# Patient Record
Sex: Male | Born: 1980 | Race: Black or African American | Hispanic: No | Marital: Single | Smoking: Former smoker
Health system: Southern US, Community
[De-identification: ages and names within clinical notes are randomized; demographics above are authoritative.]

## PROBLEM LIST (undated history)

## (undated) DIAGNOSIS — J45909 Unspecified asthma, uncomplicated: Secondary | ICD-10-CM

---
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
## 2001-12-03 ENCOUNTER — Emergency Department (HOSPITAL_COMMUNITY): Admission: EM | Admit: 2001-12-03 | Discharge: 2001-12-03 | Payer: Self-pay | Admitting: Emergency Medicine

## 2002-09-19 ENCOUNTER — Emergency Department (HOSPITAL_COMMUNITY): Admission: EM | Admit: 2002-09-19 | Discharge: 2002-09-19 | Payer: Self-pay | Admitting: Emergency Medicine

## 2002-09-20 ENCOUNTER — Emergency Department (HOSPITAL_COMMUNITY): Admission: EM | Admit: 2002-09-20 | Discharge: 2002-09-20 | Payer: Self-pay | Admitting: Emergency Medicine

## 2002-09-20 ENCOUNTER — Encounter: Payer: Self-pay | Admitting: Emergency Medicine

## 2002-09-26 ENCOUNTER — Emergency Department (HOSPITAL_COMMUNITY): Admission: EM | Admit: 2002-09-26 | Discharge: 2002-09-26 | Payer: Self-pay | Admitting: Emergency Medicine

## 2003-03-07 ENCOUNTER — Emergency Department (HOSPITAL_COMMUNITY): Admission: EM | Admit: 2003-03-07 | Discharge: 2003-03-07 | Payer: Self-pay | Admitting: *Deleted

## 2003-03-07 ENCOUNTER — Encounter: Payer: Self-pay | Admitting: Emergency Medicine

## 2006-03-27 ENCOUNTER — Ambulatory Visit: Payer: Self-pay | Admitting: Family Medicine

## 2006-04-02 ENCOUNTER — Ambulatory Visit: Payer: Self-pay | Admitting: Family Medicine

## 2006-04-16 ENCOUNTER — Emergency Department (HOSPITAL_COMMUNITY): Admission: EM | Admit: 2006-04-16 | Discharge: 2006-04-16 | Payer: Self-pay | Admitting: Emergency Medicine

## 2006-07-09 ENCOUNTER — Emergency Department (HOSPITAL_COMMUNITY): Admission: EM | Admit: 2006-07-09 | Discharge: 2006-07-09 | Payer: Self-pay | Admitting: Emergency Medicine

## 2006-07-11 ENCOUNTER — Emergency Department (HOSPITAL_COMMUNITY): Admission: EM | Admit: 2006-07-11 | Discharge: 2006-07-11 | Payer: Self-pay | Admitting: Emergency Medicine

## 2006-08-28 ENCOUNTER — Emergency Department (HOSPITAL_COMMUNITY): Admission: EM | Admit: 2006-08-28 | Discharge: 2006-08-29 | Payer: Self-pay | Admitting: Emergency Medicine

## 2006-09-30 ENCOUNTER — Ambulatory Visit: Payer: Self-pay | Admitting: Family Medicine

## 2006-10-07 ENCOUNTER — Emergency Department (HOSPITAL_COMMUNITY): Admission: EM | Admit: 2006-10-07 | Discharge: 2006-10-07 | Payer: Self-pay | Admitting: Emergency Medicine

## 2006-10-08 ENCOUNTER — Ambulatory Visit: Payer: Self-pay | Admitting: *Deleted

## 2006-12-03 ENCOUNTER — Emergency Department (HOSPITAL_COMMUNITY): Admission: EM | Admit: 2006-12-03 | Discharge: 2006-12-03 | Payer: Self-pay | Admitting: Emergency Medicine

## 2007-07-23 ENCOUNTER — Encounter (INDEPENDENT_AMBULATORY_CARE_PROVIDER_SITE_OTHER): Payer: Self-pay | Admitting: *Deleted

## 2008-01-24 ENCOUNTER — Emergency Department (HOSPITAL_COMMUNITY): Admission: EM | Admit: 2008-01-24 | Discharge: 2008-01-24 | Payer: Self-pay | Admitting: Emergency Medicine

## 2008-06-23 ENCOUNTER — Emergency Department (HOSPITAL_COMMUNITY): Admission: EM | Admit: 2008-06-23 | Discharge: 2008-06-23 | Payer: Self-pay | Admitting: Emergency Medicine

## 2008-07-21 ENCOUNTER — Emergency Department (HOSPITAL_COMMUNITY): Admission: EM | Admit: 2008-07-21 | Discharge: 2008-07-21 | Payer: Self-pay | Admitting: Emergency Medicine

## 2008-08-11 IMAGING — CR DG CHEST 2V
2 series · 2 of 2 positions shown · non-contrast
Comparison: 07/09/2006

CLINICAL DATA: Asthma, wheezing and shortness of breath

CHEST - 2 VIEW:

[w chest pa]
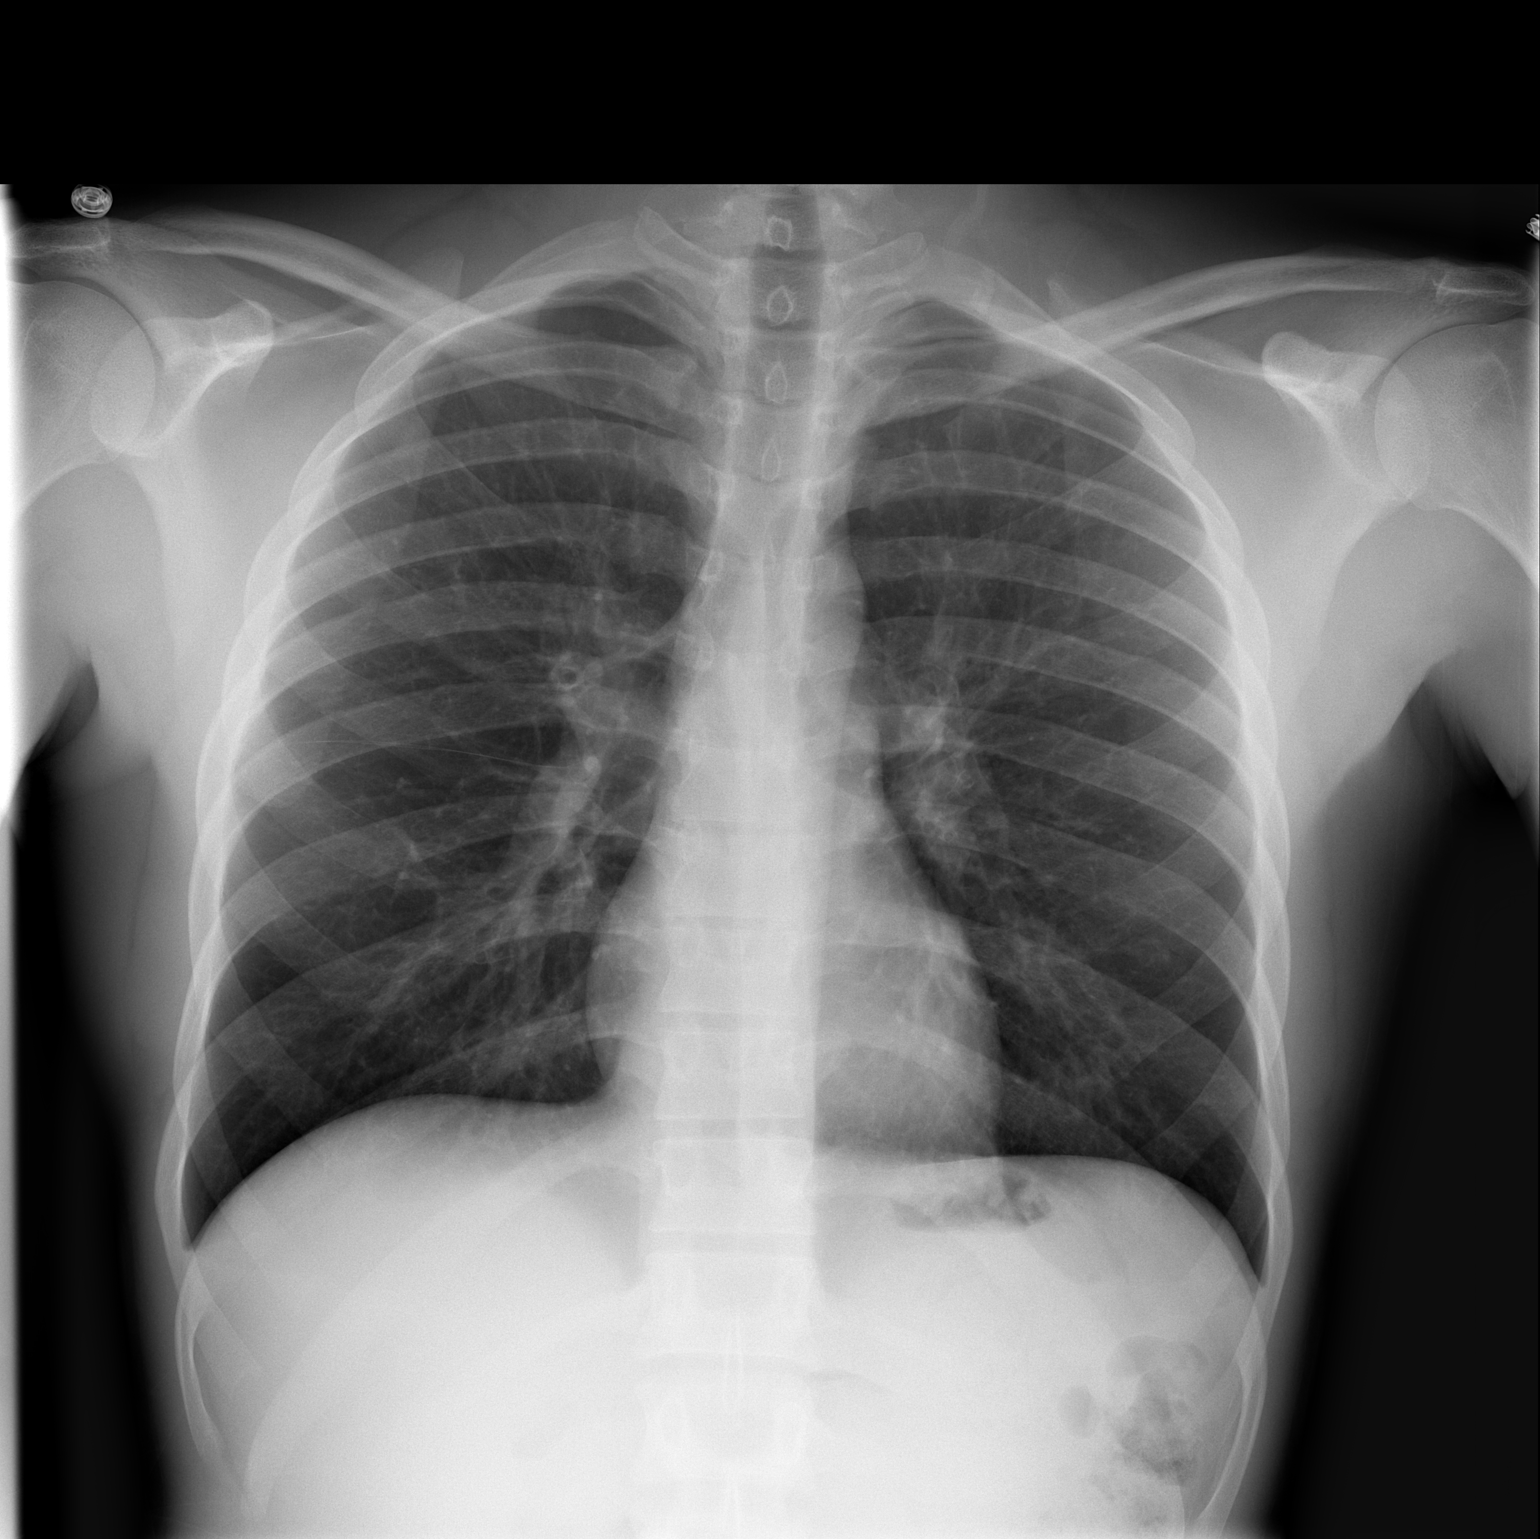

[w chest lat]
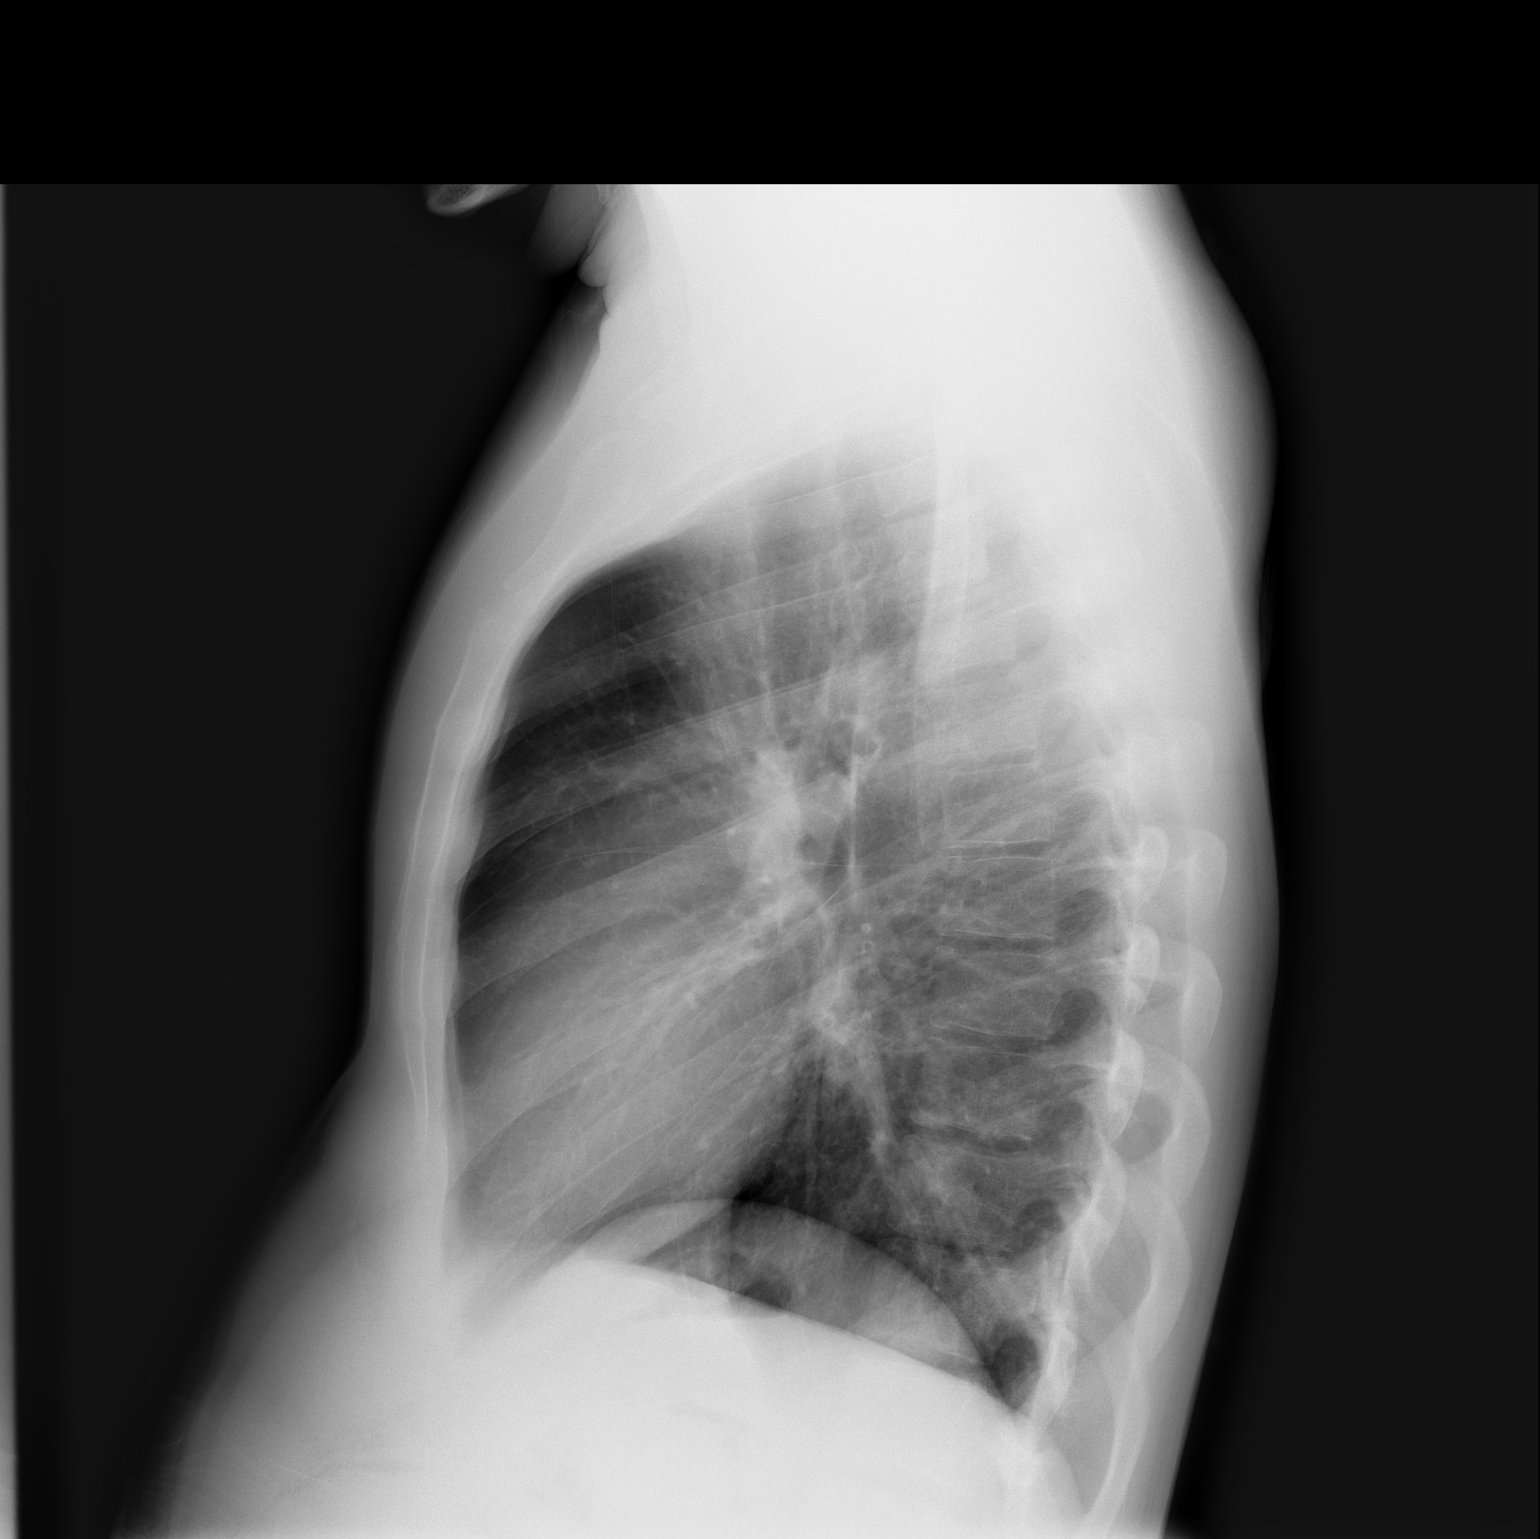

[2 of 2 positions shown; findings below may reference images not displayed]

FINDINGS: Heart and mediastinal contours are within normal limits. There is
central airway thickening without focal airspace opacity or effusion. Visualized
skeleton unremarkable.
IMPRESSION: Stable mild central airway thickening.

## 2008-11-08 ENCOUNTER — Emergency Department (HOSPITAL_COMMUNITY): Admission: EM | Admit: 2008-11-08 | Discharge: 2008-11-08 | Payer: Self-pay | Admitting: Emergency Medicine

## 2009-06-09 ENCOUNTER — Emergency Department (HOSPITAL_COMMUNITY): Admission: EM | Admit: 2009-06-09 | Discharge: 2009-06-09 | Payer: Self-pay | Admitting: Emergency Medicine

## 2009-09-25 ENCOUNTER — Emergency Department (HOSPITAL_COMMUNITY): Admission: EM | Admit: 2009-09-25 | Discharge: 2009-09-25 | Payer: Self-pay | Admitting: Emergency Medicine

## 2011-07-17 ENCOUNTER — Emergency Department (HOSPITAL_COMMUNITY)
Admission: EM | Admit: 2011-07-17 | Discharge: 2011-07-17 | Disposition: A | Discharge status: Home / Self Care | Payer: Medicaid Other | Attending: Emergency Medicine | Admitting: Emergency Medicine

## 2011-07-17 DIAGNOSIS — J40 Bronchitis, not specified as acute or chronic: Secondary | ICD-10-CM | POA: Insufficient documentation

## 2011-07-17 DIAGNOSIS — F17200 Nicotine dependence, unspecified, uncomplicated: Secondary | ICD-10-CM | POA: Insufficient documentation

## 2011-07-17 DIAGNOSIS — R509 Fever, unspecified: Secondary | ICD-10-CM | POA: Insufficient documentation

## 2011-07-17 DIAGNOSIS — J3489 Other specified disorders of nose and nasal sinuses: Secondary | ICD-10-CM | POA: Insufficient documentation

## 2011-07-17 DIAGNOSIS — R0602 Shortness of breath: Secondary | ICD-10-CM | POA: Insufficient documentation

## 2011-07-17 DIAGNOSIS — R05 Cough: Secondary | ICD-10-CM | POA: Insufficient documentation

## 2011-07-17 DIAGNOSIS — J45909 Unspecified asthma, uncomplicated: Secondary | ICD-10-CM | POA: Insufficient documentation

## 2011-07-17 DIAGNOSIS — R059 Cough, unspecified: Secondary | ICD-10-CM | POA: Insufficient documentation

## 2013-02-14 ENCOUNTER — Encounter (HOSPITAL_COMMUNITY): Payer: Self-pay | Admitting: Emergency Medicine

## 2013-02-14 ENCOUNTER — Emergency Department (HOSPITAL_COMMUNITY)
Admission: EM | Admit: 2013-02-14 | Discharge: 2013-02-15 | Disposition: A | Discharge status: Home / Self Care | Payer: Medicaid Other | Attending: Emergency Medicine | Admitting: Emergency Medicine

## 2013-02-14 DIAGNOSIS — R071 Chest pain on breathing: Secondary | ICD-10-CM | POA: Insufficient documentation

## 2013-02-14 DIAGNOSIS — R059 Cough, unspecified: Secondary | ICD-10-CM | POA: Insufficient documentation

## 2013-02-14 DIAGNOSIS — R05 Cough: Secondary | ICD-10-CM

## 2013-02-14 DIAGNOSIS — J45909 Unspecified asthma, uncomplicated: Secondary | ICD-10-CM | POA: Insufficient documentation

## 2013-02-14 DIAGNOSIS — J029 Acute pharyngitis, unspecified: Secondary | ICD-10-CM | POA: Insufficient documentation

## 2013-02-14 DIAGNOSIS — J309 Allergic rhinitis, unspecified: Secondary | ICD-10-CM | POA: Insufficient documentation

## 2013-02-14 DIAGNOSIS — H5789 Other specified disorders of eye and adnexa: Secondary | ICD-10-CM | POA: Insufficient documentation

## 2013-02-14 HISTORY — DX: Unspecified asthma, uncomplicated: J45.909

## 2013-02-14 NOTE — ED Notes (Signed)
Per EMS patient c/o scratchy throat runny nose and has not been able to sleep. Pt is Alert and orient V/S/S, lung sounds clear and ambulatory.

## 2013-02-14 NOTE — ED Notes (Signed)
[id number]<AV> Expected date:<BR> Expected time:<BR> Means of arrival:<BR> Comments:<BR> EMS/hx asthma-cold and flu symptoms-EMS states clear lungs

## 2013-02-15 MED ORDER — ALBUTEROL SULFATE (5 MG/ML) 0.5% IN NEBU
2.5000 mg | INHALATION_SOLUTION | RESPIRATORY_TRACT | Status: DC
  Administered 2013-02-15: 2.5 mg via RESPIRATORY_TRACT
  Filled 2013-02-15: qty 0.5

## 2013-02-15 MED ORDER — OXYMETAZOLINE HCL 0.05 % NA SOLN
1.0000 | Freq: Once | NASAL | Status: AC
  Administered 2013-02-15: 1 via NASAL
  Filled 2013-02-15: qty 15

## 2013-02-15 MED ORDER — BENZONATATE 200 MG PO CAPS: 200.0000 mg | ORAL_CAPSULE | Freq: Once | ORAL | Status: DC

## 2013-02-15 MED ORDER — IPRATROPIUM BROMIDE 0.02 % IN SOLN
0.5000 mg | RESPIRATORY_TRACT | Status: DC
  Administered 2013-02-15: 0.5 mg via RESPIRATORY_TRACT
  Filled 2013-02-15: qty 2.5

## 2013-02-15 MED ORDER — LORATADINE 10 MG PO TABS: 10.0000 mg | ORAL_TABLET | Freq: Once | ORAL | Status: DC

## 2013-02-15 MED ORDER — AEROCHAMBER PLUS FLO-VU LARGE MISC
1.0000 | Freq: Once | Status: AC
  Administered 2013-02-15: 1
  Filled 2013-02-15: qty 1

## 2013-02-15 MED ORDER — BENZONATATE 100 MG PO CAPS
200.0000 mg | ORAL_CAPSULE | Freq: Once | ORAL | Status: AC
  Administered 2013-02-15: 200 mg via ORAL
  Filled 2013-02-15: qty 2

## 2013-02-15 MED ORDER — LORATADINE 10 MG PO TABS
10.0000 mg | ORAL_TABLET | Freq: Once | ORAL | Status: AC
  Administered 2013-02-15: 10 mg via ORAL
  Filled 2013-02-15: qty 1

## 2013-02-15 NOTE — ED Provider Notes (Signed)
History     CSN: [id number]  Arrival date & time 02/14/13  2347   First MD Initiated Contact with Patient 02/15/13 0002      Chief Complaint  Patient presents with  . Nasal Congestion    (Consider location/radiation/quality/duration/timing/severity/associated sxs/prior treatment) HPI Jason Roy is a 32 y.o. male presents with a two-day history of runny nose, sinus congestion, sinus pressure, cough, and mild to moderate scratchy sore throat pain. Patient has had difficulty sleeping secondary to his symptoms and cough. Patient is had some associated itchy watery eyes that have been red. Patient's had no difficulty breathing, he's had some mild sharp chest pain is only there when he is coughing, he's had no other chest pain, no dizziness or lightheadedness, no fever, no chills, no abdominal pain, no nausea vomiting or diarrhea.   Past Medical History  Diagnosis Date  . Asthma     No past surgical history on file.  Family History  Problem Relation Age of Onset  . Asthma Mother     History  Substance Use Topics  . Smoking status: Not on file  . Smokeless tobacco: Not on file  . Alcohol Use: No      Review of Systems At least 10pt or greater review of systems completed and are negative except where specified in the HPI.  Allergies  Review of patient's allergies indicates no known allergies.  Home Medications   Current Outpatient Rx  Name  Route  Sig  Dispense  Refill  . benzonatate (TESSALON) 200 MG capsule   Oral   Take 1 capsule (200 mg total) by mouth once.   12 capsule   0   . loratadine (CLARITIN) 10 MG tablet   Oral   Take 1 tablet (10 mg total) by mouth once.   30 tablet   0     BP 147/84  Pulse 79  Temp(Src) 97.7 F (36.5 C) (Oral)  Resp 18  SpO2 100%  Physical Exam  Nursing notes reviewed.  Electronic medical record reviewed. VITAL SIGNS:   Filed Vitals:   02/14/13 2347 02/14/13 2352 02/15/13 0008  BP: 142/103 140/90 147/84  Pulse:  81  79  Temp: 97.7 F (36.5 C)    TempSrc: Oral    Resp: 18    SpO2: 100%  100%   CONSTITUTIONAL: Awake, oriented, appears non-toxic HENT: Atraumatic, normocephalic, oral mucosa pink and moist, airway patent. Cobblestoning of the posterior pharynx. Nasal turbinates are boggy and mildly erythematous with clear nasal drainage. External ears normal. Clear fluid behind the right TM left TM appears normal. EYES: Conjunctiva mildly injected, EOMI, PERRLA NECK: Trachea midline, non-tender, supple CARDIOVASCULAR: Normal heart rate, Normal rhythm, No murmurs, rubs, gallops PULMONARY/CHEST: Clear to auscultation, no rhonchi, wheezes, or rales. Symmetrical breath sounds. Non-tender. NEUROLOGIC: Non-focal, moving all four extremities, no gross sensory or motor deficits. EXTREMITIES: No clubbing, cyanosis, or edema SKIN: Warm, Dry, No erythema, No rash  ED Course  Procedures (including critical care time)  Labs Reviewed - No data to display No results found.   1. Allergic rhinitis   2. Asthma   3. Cough       MDM  Patient to the ED with symptoms of allergic rhinitis, she has a history of asthma, currently on his expirations, he is making some upper airway wheezing noises although I don't hear anything lower. We'll treat with DuoNeb, symptomatic treatments -  But the patient on a second-generation antihistamine to control his symptoms as well as Lawyer  cough.  I explained the diagnosis and have given explicit precautions to return to the ER including chest pain, shortness of breath, asthma exacerbation or any other new or worsening symptoms. The patient understands and accepts the medical plan as it's been dictated and I have answered their questions. Discharge instructions concerning home care and prescriptions have been given.  The patient is STABLE and is discharged to home in good condition.         Jones Skene, MD 02/15/13 2440

## 2013-05-19 ENCOUNTER — Emergency Department (HOSPITAL_COMMUNITY): Payer: Medicaid Other

## 2013-05-19 ENCOUNTER — Encounter (HOSPITAL_COMMUNITY): Payer: Self-pay | Admitting: *Deleted

## 2013-05-19 ENCOUNTER — Emergency Department (HOSPITAL_COMMUNITY)
Admission: EM | Admit: 2013-05-19 | Discharge: 2013-05-19 | Disposition: A | Discharge status: Home / Self Care | Payer: Medicaid Other | Attending: Emergency Medicine | Admitting: Emergency Medicine

## 2013-05-19 DIAGNOSIS — Y998 Other external cause status: Secondary | ICD-10-CM | POA: Insufficient documentation

## 2013-05-19 DIAGNOSIS — Y92410 Unspecified street and highway as the place of occurrence of the external cause: Secondary | ICD-10-CM | POA: Insufficient documentation

## 2013-05-19 DIAGNOSIS — T07XXXA Unspecified multiple injuries, initial encounter: Secondary | ICD-10-CM

## 2013-05-19 DIAGNOSIS — Y9389 Activity, other specified: Secondary | ICD-10-CM | POA: Insufficient documentation

## 2013-05-19 DIAGNOSIS — J45909 Unspecified asthma, uncomplicated: Secondary | ICD-10-CM | POA: Insufficient documentation

## 2013-05-19 DIAGNOSIS — Z79899 Other long term (current) drug therapy: Secondary | ICD-10-CM | POA: Insufficient documentation

## 2013-05-19 DIAGNOSIS — IMO0002 Reserved for concepts with insufficient information to code with codable children: Secondary | ICD-10-CM | POA: Insufficient documentation

## 2013-05-19 MED ORDER — IBUPROFEN 800 MG PO TABS: 800.0000 mg | ORAL_TABLET | Freq: Three times a day (TID) | ORAL | Status: DC | PRN

## 2013-05-19 MED ORDER — IBUPROFEN 400 MG PO TABS
800.0000 mg | ORAL_TABLET | Freq: Once | ORAL | Status: AC
  Administered 2013-05-19: 800 mg via ORAL
  Filled 2013-05-19: qty 2
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
## 2013-05-19 NOTE — ED Notes (Signed)
Ortho at bedside at this time.

## 2013-05-19 NOTE — Progress Notes (Signed)
Orthopedic Tech Progress Note Patient Details:  Jason Roy [birth year] [id number]  Ortho Devices Type of Ortho Device: Crutches   Haskell Flirt 05/19/2013, 2:04 AM
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
## 2013-05-20 NOTE — ED Provider Notes (Signed)
Medical screening examination/treatment/procedure(s) were performed by non-physician practitioner and as supervising physician I was immediately available for consultation/collaboration.   Brandt Loosen, MD 05/20/13 [phone]

## 2014-08-05 ENCOUNTER — Encounter (HOSPITAL_COMMUNITY): Payer: Self-pay | Admitting: Emergency Medicine

## 2014-08-05 ENCOUNTER — Emergency Department (HOSPITAL_COMMUNITY)
Admission: EM | Admit: 2014-08-05 | Discharge: 2014-08-05 | Disposition: A | Discharge status: Home / Self Care | Payer: Medicaid Other | Attending: Emergency Medicine | Admitting: Emergency Medicine

## 2014-08-05 ENCOUNTER — Emergency Department (HOSPITAL_COMMUNITY): Payer: Medicaid Other

## 2014-08-05 DIAGNOSIS — Z7952 Long term (current) use of systemic steroids: Secondary | ICD-10-CM | POA: Diagnosis not present

## 2014-08-05 DIAGNOSIS — J45909 Unspecified asthma, uncomplicated: Secondary | ICD-10-CM | POA: Diagnosis not present

## 2014-08-05 DIAGNOSIS — M791 Myalgia: Secondary | ICD-10-CM | POA: Diagnosis not present

## 2014-08-05 DIAGNOSIS — Z79899 Other long term (current) drug therapy: Secondary | ICD-10-CM | POA: Diagnosis not present

## 2014-08-05 DIAGNOSIS — R000 Tachycardia, unspecified: Secondary | ICD-10-CM | POA: Insufficient documentation

## 2014-08-05 DIAGNOSIS — R51 Headache: Secondary | ICD-10-CM | POA: Insufficient documentation

## 2014-08-05 DIAGNOSIS — J029 Acute pharyngitis, unspecified: Secondary | ICD-10-CM | POA: Diagnosis not present

## 2014-08-05 DIAGNOSIS — J020 Streptococcal pharyngitis: Secondary | ICD-10-CM

## 2014-08-05 LAB — CBC
HEMATOCRIT: 45.5 % (ref 39.0–52.0)
Hemoglobin: 16 g/dL (ref 13.0–17.0)
MCH: 31.5 pg (ref 26.0–34.0)
MCHC: 35.2 g/dL (ref 30.0–36.0)
MCV: 89.6 fL (ref 78.0–100.0)
Platelets: 227 10*3/uL (ref 150–400)
RBC: 5.08 MIL/uL (ref 4.22–5.81)
RDW: 12.8 % (ref 11.5–15.5)
WBC: 17.5 10*3/uL — ABNORMAL HIGH (ref 4.0–10.5)

## 2014-08-05 LAB — BASIC METABOLIC PANEL
ANION GAP: 15 (ref 5–15)
BUN: 14 mg/dL (ref 6–23)
CHLORIDE: 95 meq/L — AB (ref 96–112)
CO2: 28 meq/L (ref 19–32)
CREATININE: 1.64 mg/dL — AB (ref 0.50–1.35)
Calcium: 9.8 mg/dL (ref 8.4–10.5)
GFR calc non Af Amer: 54 mL/min — ABNORMAL LOW (ref >90)
GFR, EST AFRICAN AMERICAN: 62 mL/min — AB (ref >90)
Glucose, Bld: 99 mg/dL (ref 70–99)
Potassium: 4.1 mEq/L (ref 3.7–5.3)
SODIUM: 138 meq/L (ref 137–147)

## 2014-08-05 LAB — RAPID STREP SCREEN (MED CTR MEBANE ONLY): STREPTOCOCCUS, GROUP A SCREEN (DIRECT): POSITIVE — AB

## 2014-08-05 MED ORDER — HYDROCODONE-ACETAMINOPHEN 7.5-325 MG/15ML PO SOLN: 15.0000 mL | ORAL | Status: DC | PRN

## 2014-08-05 MED ORDER — ACETAMINOPHEN 325 MG PO TABS
650.0000 mg | ORAL_TABLET | Freq: Once | ORAL | Status: AC
  Administered 2014-08-05: 650 mg via ORAL
  Filled 2014-08-05: qty 2

## 2014-08-05 MED ORDER — SODIUM CHLORIDE 0.9 % IV BOLUS (SEPSIS)
1000.0000 mL | Freq: Once | INTRAVENOUS | Status: AC
  Administered 2014-08-05: 1000 mL via INTRAVENOUS

## 2014-08-05 MED ORDER — PENICILLIN G BENZATHINE 1200000 UNIT/2ML IM SUSP
1.2000 10*6.[IU] | Freq: Once | INTRAMUSCULAR | Status: AC
  Administered 2014-08-05: 1.2 10*6.[IU] via INTRAMUSCULAR
  Filled 2014-08-05: qty 2

## 2014-08-05 NOTE — ED Notes (Signed)
Pt c/o sore throat, URI sx and body aches x 2 days; fever noted

## 2014-08-05 NOTE — Discharge Instructions (Signed)
Take Hycet for severe pain only. No driving or operating heavy machinery while taking Hycet. This medication may cause drowsiness.  Strep Throat Strep throat is an infection of the throat caused by a bacteria named Streptococcus pyogenes. Your health care provider may call the infection streptococcal "tonsillitis" or "pharyngitis" depending on whether there are signs of inflammation in the tonsils or back of the throat. Strep throat is most common in children aged 5-15 years during the cold months of the year, but it can occur in people of any age during any season. This infection is spread from person to person (contagious) through coughing, sneezing, or other close contact. SIGNS AND SYMPTOMS   Fever or chills.  Painful, swollen, red tonsils or throat.  Pain or difficulty when swallowing.  White or yellow spots on the tonsils or throat.  Swollen, tender lymph nodes or "glands" of the neck or under the jaw.  Red rash all over the body (rare). DIAGNOSIS  Many different infections can cause the same symptoms. A test must be done to confirm the diagnosis so the right treatment can be given. A "rapid strep test" can help your health care provider make the diagnosis in a few minutes. If this test is not available, a light swab of the infected area can be used for a throat culture test. If a throat culture test is done, results are usually available in a day or two. TREATMENT  Strep throat is treated with antibiotic medicine. HOME CARE INSTRUCTIONS   Gargle with 1 tsp of salt in 1 cup of warm water, 3-4 times per day or as needed for comfort.  Family members who also have a sore throat or fever should be tested for strep throat and treated with antibiotics if they have the strep infection.  Make sure everyone in your household washes their hands well.  Do not share food, drinking cups, or personal items that could cause the infection to spread to others.  You may need to eat a soft food  diet until your sore throat gets better.  Drink enough water and fluids to keep your urine clear or pale yellow. This will help prevent dehydration.  Get plenty of rest.  Stay home from school, day care, or work until you have been on antibiotics for 24 hours.  Take medicines only as directed by your health care provider.  Take your antibiotic medicine as directed by your health care provider. Finish it even if you start to feel better. SEEK MEDICAL CARE IF:   The glands in your neck continue to enlarge.  You develop a rash, cough, or earache.  You cough up green, yellow-brown, or bloody sputum.  You have pain or discomfort not controlled by medicines.  Your problems seem to be getting worse rather than better.  You have a fever. SEEK IMMEDIATE MEDICAL CARE IF:   You develop any new symptoms such as vomiting, severe headache, stiff or painful neck, chest pain, shortness of breath, or trouble swallowing.  You develop severe throat pain, drooling, or changes in your voice.  You develop swelling of the neck, or the skin on the neck becomes red and tender.  You develop signs of dehydration, such as fatigue, dry mouth, and decreased urination.  You become increasingly sleepy, or you cannot wake up completely. MAKE SURE YOU:  Understand these instructions.  Will watch your condition.  Will get help right away if you are not doing well or get worse. Document Released: 10/19/2000 Document Revised: 03/08/2014  Document Reviewed: 12/21/2010 [facility] Patient Information 2015 [place]. This information is not intended to replace advice given to you by your health care provider. Make sure you discuss any questions you have with your health care provider.

## 2014-08-05 NOTE — ED Provider Notes (Signed)
CSN: [id number]     Arrival date & time 08/05/14  1245 History  This chart was scribed for non-physician practitioner, Johnnette Gourd, PA-C, working with Gerhard Munch, MD by Charline Bills, ED Scribe. This patient was seen in room TR08C/TR08C and the patient's care was started at 1:00 PM.   Chief Complaint  Patient presents with  . Sore Throat  . Generalized Body Aches   The history is provided by the patient. No language interpreter was used.   HPI Comments: RAUNAK ANTUNA is a 33 y.o. male who presents to the Emergency Department complaining of gradually worsening sore throat onset 2 days ago. Pt states that pain is so severe that he has not been able to eat anything but applesauce for 2 days. He reports associated cough, myalgias, HA, fever. No medications tried PTA.  Past Medical History  Diagnosis Date  . Asthma    History reviewed. No pertinent past surgical history. Family History  Problem Relation Age of Onset  . Asthma Mother    History  Substance Use Topics  . Smoking status: Not on file  . Smokeless tobacco: Not on file  . Alcohol Use: No    Review of Systems  Constitutional: Positive for fever.  HENT: Positive for sore throat.   Respiratory: Positive for cough.   Musculoskeletal: Positive for myalgias.  Neurological: Positive for headaches.  All other systems reviewed and are negative.  Allergies  Review of patient's allergies indicates no known allergies.  Home Medications   Prior to Admission medications   Medication Sig Start Date End Date Taking? Authorizing Provider  albuterol (PROVENTIL HFA;VENTOLIN HFA) 108 (90 BASE) MCG/ACT inhaler Inhale 2 puffs into the lungs every 6 (six) hours as needed for wheezing.    Historical Provider, MD  beclomethasone (QVAR) 80 MCG/ACT inhaler Inhale 1 puff into the lungs 2 (two) times daily.    Historical Provider, MD  HYDROcodone-acetaminophen (HYCET) 7.5-325 mg/15 ml solution Take 15 mLs by mouth every 4 (four) hours as  needed for moderate pain or severe pain. 08/05/14   Trevor Mace, PA-C  ibuprofen (ADVIL,MOTRIN) 800 MG tablet Take 1 tablet (800 mg total) by mouth every 8 (eight) hours as needed for pain. 05/19/13   Phill Mutter Dammen, PA-C  montelukast (SINGULAIR) 10 MG tablet Take 10 mg by mouth at bedtime.    Historical Provider, MD   Triage Vitals: BP 147/87  Pulse 112  Temp(Src) 100.3 F (37.9 C) (Oral)  Resp 20  SpO2 99% Physical Exam  Nursing note and vitals reviewed. Constitutional: He is oriented to person, place, and time. He appears well-developed and well-nourished. No distress.  Clammy  HENT:  Head: Normocephalic and atraumatic.  Mouth/Throat: Posterior oropharyngeal edema and posterior oropharyngeal erythema present. No oropharyngeal exudate or tonsillar abscesses.  Eyes: Conjunctivae and EOM are normal.  Neck: Normal range of motion. Neck supple.  Cardiovascular: Regular rhythm and normal heart sounds.  Tachycardia present.   Pulmonary/Chest: Effort normal and breath sounds normal.  Musculoskeletal: Normal range of motion. He exhibits no edema.  Lymphadenopathy:    He has cervical adenopathy (anterior).  Neurological: He is alert and oriented to person, place, and time.  Skin: Skin is warm and dry.  Psychiatric: He has a normal mood and affect. His behavior is normal.   ED Course  Procedures (including critical care time) DIAGNOSTIC STUDIES: Oxygen Saturation is 99% on RA, normal by my interpretation.    COORDINATION OF CARE: 1:04 PM-Discussed treatment plan which includes strep screen  and IV fluids with pt at bedside and pt agreed to plan.   Labs Review Labs Reviewed  RAPID STREP SCREEN - Abnormal; Notable for the following:    Streptococcus, Group A Screen (Direct) POSITIVE (*)    All other components within normal limits  CBC - Abnormal; Notable for the following:    WBC 17.5 (*)    All other components within normal limits  BASIC METABOLIC PANEL - Abnormal; Notable for  the following:    Chloride 95 (*)    Creatinine, Ser 1.64 (*)    GFR calc non Af Amer 54 (*)    GFR calc Af Amer 62 (*)    All other components within normal limits   Imaging Review Dg Chest 2 View  08/05/2014   CLINICAL DATA:  33 year old male with sore throat fever cough and generalized body aches x 3 days. Initial encounter.  EXAM: CHEST  2 VIEW  COMPARISON:  12/03/2006 and earlier.  FINDINGS: Mildly lower lung volumes. Normal cardiac size and mediastinal contours. Visualized tracheal air column is within normal limits. No pneumothorax, pulmonary edema, pleural effusion or confluent pulmonary opacity. No osseous abnormality identified.  IMPRESSION: Negative, no acute cardiopulmonary abnormality.   Electronically Signed   By: Augusto GambleLee  Hall M.D.   On: 08/05/2014 13:50    EKG Interpretation None      MDM   Final diagnoses:  Strep throat   Temperature 100.3, tachycardic, vitals otherwise stable. He is nontoxic-appearing and in no apparent distress. Patient received Tylenol and IV fluids with improvement of vital signs. Rapid strep positive. no tonsillar abscess. Leukocytosis of 17.5. Tolerates PO. Treated with Bicillin injection in the emergency department. Discharge with hycet. Stable for d/c. Return precautions given. Patient states understanding of treatment care plan and is agreeable.  I personally performed the services described in this documentation, which was scribed in my presence. The recorded information has been reviewed and is accurate.    Trevor Maceobyn M Albert, PA-C 08/05/14 1417  Trevor Maceobyn M Albert, PA-C 08/05/14 [phone]

## 2014-08-05 NOTE — ED Provider Notes (Signed)
Medical screening examination/treatment/procedure(s) were performed by non-physician practitioner and as supervising physician I was immediately available for consultation/collaboration.  Gerhard Munchobert Jahniyah Revere, MD 08/05/14 [phone]

## 2014-11-09 ENCOUNTER — Emergency Department (HOSPITAL_COMMUNITY)
Admission: EM | Admit: 2014-11-09 | Discharge: 2014-11-09 | Disposition: A | Discharge status: Home / Self Care | Payer: Medicaid Other | Attending: Emergency Medicine | Admitting: Emergency Medicine

## 2014-11-09 ENCOUNTER — Emergency Department (HOSPITAL_COMMUNITY): Payer: Medicaid Other

## 2014-11-09 ENCOUNTER — Encounter (HOSPITAL_COMMUNITY): Payer: Self-pay | Admitting: Neurology

## 2014-11-09 DIAGNOSIS — R000 Tachycardia, unspecified: Secondary | ICD-10-CM | POA: Diagnosis not present

## 2014-11-09 DIAGNOSIS — Z7951 Long term (current) use of inhaled steroids: Secondary | ICD-10-CM | POA: Insufficient documentation

## 2014-11-09 DIAGNOSIS — J45909 Unspecified asthma, uncomplicated: Secondary | ICD-10-CM | POA: Diagnosis present

## 2014-11-09 DIAGNOSIS — Z79899 Other long term (current) drug therapy: Secondary | ICD-10-CM | POA: Diagnosis not present

## 2014-11-09 DIAGNOSIS — Z87891 Personal history of nicotine dependence: Secondary | ICD-10-CM | POA: Diagnosis not present

## 2014-11-09 DIAGNOSIS — J45901 Unspecified asthma with (acute) exacerbation: Secondary | ICD-10-CM

## 2014-11-09 LAB — CBC WITH DIFFERENTIAL/PLATELET
BASOS ABS: 0 10*3/uL (ref 0.0–0.1)
BASOS PCT: 0 % (ref 0–1)
EOS ABS: 0.1 10*3/uL (ref 0.0–0.7)
Eosinophils Relative: 2 % (ref 0–5)
HCT: 47.2 % (ref 39.0–52.0)
Hemoglobin: 16.6 g/dL (ref 13.0–17.0)
LYMPHS ABS: 1.7 10*3/uL (ref 0.7–4.0)
Lymphocytes Relative: 27 % (ref 12–46)
MCH: 32.7 pg (ref 26.0–34.0)
MCHC: 35.2 g/dL (ref 30.0–36.0)
MCV: 92.9 fL (ref 78.0–100.0)
Monocytes Absolute: 0.7 10*3/uL (ref 0.1–1.0)
Monocytes Relative: 11 % (ref 3–12)
NEUTROS PCT: 60 % (ref 43–77)
Neutro Abs: 3.8 10*3/uL (ref 1.7–7.7)
PLATELETS: 216 10*3/uL (ref 150–400)
RBC: 5.08 MIL/uL (ref 4.22–5.81)
RDW: 13.5 % (ref 11.5–15.5)
WBC: 6.4 10*3/uL (ref 4.0–10.5)

## 2014-11-09 LAB — I-STAT CHEM 8, ED
BUN: 9 mg/dL (ref 6–23)
CREATININE: 1.2 mg/dL (ref 0.50–1.35)
Calcium, Ion: 1.18 mmol/L (ref 1.12–1.23)
Chloride: 97 mEq/L (ref 96–112)
GLUCOSE: 85 mg/dL (ref 70–99)
HCT: 53 % — ABNORMAL HIGH (ref 39.0–52.0)
HEMOGLOBIN: 18 g/dL — AB (ref 13.0–17.0)
Potassium: 3.6 mmol/L (ref 3.5–5.1)
SODIUM: 138 mmol/L (ref 135–145)
TCO2: 24 mmol/L (ref 0–100)

## 2014-11-09 LAB — I-STAT TROPONIN, ED: Troponin i, poc: 0 ng/mL (ref 0.00–0.08)

## 2014-11-09 MED ORDER — IBUPROFEN 400 MG PO TABS
600.0000 mg | ORAL_TABLET | Freq: Once | ORAL | Status: AC
  Administered 2014-11-09: 600 mg via ORAL
  Filled 2014-11-09 (×2): qty 1

## 2014-11-09 MED ORDER — PREDNISONE 20 MG PO TABS: ORAL_TABLET | ORAL | Status: DC

## 2014-11-09 MED ORDER — IPRATROPIUM-ALBUTEROL 0.5-2.5 (3) MG/3ML IN SOLN
3.0000 mL | Freq: Once | RESPIRATORY_TRACT | Status: AC
  Administered 2014-11-09: 3 mL via RESPIRATORY_TRACT
  Filled 2014-11-09: qty 3

## 2014-11-09 MED ORDER — BENZONATATE 100 MG PO CAPS: 100.0000 mg | ORAL_CAPSULE | Freq: Three times a day (TID) | ORAL | Status: DC

## 2014-11-09 MED ORDER — PREDNISONE 20 MG PO TABS
60.0000 mg | ORAL_TABLET | Freq: Once | ORAL | Status: AC
  Administered 2014-11-09: 60 mg via ORAL
  Filled 2014-11-09: qty 3

## 2014-11-09 NOTE — ED Provider Notes (Signed)
CSN: [id number]     Arrival date & time 11/09/14  1035 History   First MD Initiated Contact with Patient 11/09/14 1037     Chief Complaint  Patient presents with  . Asthma     (Consider location/radiation/quality/duration/timing/severity/associated sxs/prior Treatment) HPI  Jason Roy is a 34 y.o. male with PMH of asthma presenting with 5 day history of URI symptoms as well as wheezing. Patient with Qvar, albuterol nebulizer, Singulair home without improvement of symptoms he has also been taking Mucinex without relief. Pt with cough productive of thick sputum. Patient has been given a total 10 mg albuterol and 0.5 Atrovent by EMS. Patient also with subjective fevers and cough productive of thick sputum. Patient denies requiring steroids or hospitalization for asthma for over 3 years ago. Patient denies chest pain, nausea, vomiting, abdominal pain. He did note one episode of emesis yesterday after coughing spell. No blood. No home O2.   Past Medical History  Diagnosis Date  . Asthma    History reviewed. No pertinent past surgical history. Family History  Problem Relation Age of Onset  . Asthma Mother    History  Substance Use Topics  . Smoking status: Former Games developer  . Smokeless tobacco: Not on file  . Alcohol Use: No    Review of Systems 10 Systems reviewed and are negative for acute change except as noted in the HPI.    Allergies  Review of patient's allergies indicates no known allergies.  Home Medications   Prior to Admission medications   Medication Sig Start Date End Date Taking? Authorizing Provider  albuterol (PROVENTIL HFA;VENTOLIN HFA) 108 (90 BASE) MCG/ACT inhaler Inhale 2 puffs into the lungs every 6 (six) hours as needed for wheezing.   Yes Historical Provider, MD  beclomethasone (QVAR) 80 MCG/ACT inhaler Inhale 1 puff into the lungs 2 (two) times daily.   Yes Historical Provider, MD  guaiFENesin (MUCINEX) 600 MG 12 hr tablet Take 600 mg by mouth 2 (two)  times daily.   Yes Historical Provider, MD  ibuprofen (ADVIL,MOTRIN) 800 MG tablet Take 1 tablet (800 mg total) by mouth every 8 (eight) hours as needed for pain. 05/19/13  Yes Peter S Dammen, PA-C  montelukast (SINGULAIR) 10 MG tablet Take 10 mg by mouth at bedtime.   Yes Historical Provider, MD  benzonatate (TESSALON) 100 MG capsule Take 1 capsule (100 mg total) by mouth every 8 (eight) hours. 11/09/14   Louann Sjogren, PA-C  HYDROcodone-acetaminophen (HYCET) 7.5-325 mg/15 ml solution Take 15 mLs by mouth every 4 (four) hours as needed for moderate pain or severe pain. Patient not taking: Reported on 11/09/2014 08/05/14   Kathrynn Speed, PA-C  predniSONE (DELTASONE) 20 MG tablet 2 tabs po daily x 5 days 11/09/14   [place] L Phylisha Dix, PA-C   BP 130/85 mmHg  Pulse 106  Temp(Src) 99.3 F (37.4 C) (Oral)  Resp 15  Ht  (1.753 m)  Wt 193 lb (87.544 kg)  BMI 28.49 kg/m2  SpO2 92% Physical Exam  Constitutional: He appears well-developed and well-nourished. No distress.  HENT:  Head: Normocephalic and atraumatic.  Eyes: Conjunctivae and EOM are normal. Right eye exhibits no discharge. Left eye exhibits no discharge.  Cardiovascular: Regular rhythm.   Tachycardic.  Pulmonary/Chest:  Patient with diffuse wheezes and rhonchi with diminished air movement. Patient with symmetrical chest expansion. No accessory muscle use or respiratory distress noted.  Abdominal: Soft. Bowel sounds are normal. He exhibits no distension. There is no tenderness.  Neurological:  He is alert. He exhibits normal muscle tone. Coordination normal.  Skin: Skin is warm and dry. He is not diaphoretic.  Nursing note and vitals reviewed.   ED Course  Procedures (including critical care time) Labs Review Labs Reviewed  I-STAT CHEM 8, ED - Abnormal; Notable for the following:    Hemoglobin 18.0 (*)    HCT 53.0 (*)    All other components within normal limits  CBC WITH DIFFERENTIAL  I-STAT TROPOININ, ED    Imaging  Review Dg Chest 2 View  11/09/2014   CLINICAL DATA:  Cough starting Thursday, fever, shortness of Breath  EXAM: CHEST  2 VIEW  COMPARISON:  08/05/2014  FINDINGS: Cardiomediastinal silhouette is stable. No acute infiltrate or pleural effusion. No pulmonary edema. Mild perihilar bronchitic changes. Bony thorax is unremarkable.  IMPRESSION: No acute infiltrate or pulmonary edema. Mild perihilar bronchitic changes.   Electronically Signed   By: Natasha MeadLiviu  Pop M.D.   On: 11/09/2014 12:59     EKG Interpretation   Date/Time:  Tuesday November 09 2014 10:37:44 EST Ventricular Rate:  108 PR Interval:  166 QRS Duration: 87 QT Interval:  317 QTC Calculation: 425 R Axis:   88 Text Interpretation:  Sinus tachycardia LAE, consider biatrial enlargement  ST elev, probable normal early repol pattern Baseline wander in lead(s) V2  V3 V4 V6 No previous ECGs available Confirmed by RANCOUR  MD, STEPHEN  ([id number]) on 11/09/2014 10:44:42 AM      MDM   Final diagnoses:  Asthma  Asthma exacerbation   Patient ambulated in ED with O2 saturations maintained >90, no current signs of respiratory distress. CXR without evidence of PNA.  Pt with tachycardia to 122 with ambulation but returned to 115 with rest. Pt given 10mg  albuterol by EMS which is likely contributing.  Lung exam improved after nebulizer treatment. Prednisone given in the ED and pt will bd dc with 5 day burst. Pt states they are breathing at baseline. Pt has been instructed to continue using prescribed medications and to speak with PCP about today's exacerbation.   Discussed return precautions with patient. Discussed all results and patient verbalizes understanding and agrees with plan.  Case has been discussed with Dr. Manus Gunningancour who agrees with the above plan and to discharge.    Louann SjogrenVictoria L Izeyah Deike, PA-C 11/09/14 1456  Glynn OctaveStephen Rancour, MD 11/09/14 Paulo [id number]

## 2014-11-09 NOTE — ED Notes (Signed)
Per EMS- pt comes from home reports since Friday as had asthma exacerbation, has been using inhalers and muccinex with no relief. Has aches and sore throat. Wheezing and rhonchi noted. Given total of 10 mg albuterol, 0.5 atrovent. BP 150/80, HR 107, 98% treatment mask, RR 22.

## 2014-11-09 NOTE — Discharge Instructions (Signed)
Return to the emergency room with worsening of symptoms, new symptoms or with symptoms that are concerning especially chest pain, shortness of breath, persistent or worsening symptoms, coughing up blood, feel dizzy lightheaded or faint. Drink plenty of fluids with electrolytes especially Gatorade. OTC cold medications such as mucinex, nyquil, dayquil are recommended. Chloraseptic for sore throat.   Please call your doctor for a followup appointment within 24-48 hours. When you talk to your doctor please let them know that you were seen in the emergency department and have them acquire all of your records so that they can discuss the findings with you and formulate a treatment plan to fully care for your new and ongoing problems.   Asthma Asthma is a recurring condition in which the airways tighten and narrow. Asthma can make it difficult to breathe. It can cause coughing, wheezing, and shortness of breath. Asthma episodes, also called asthma attacks, range from minor to life-threatening. Asthma cannot be cured, but medicines and lifestyle changes can help control it. CAUSES Asthma is believed to be caused by inherited (genetic) and environmental factors, but its exact cause is unknown. Asthma may be triggered by allergens, lung infections, or irritants in the air. Asthma triggers are different for each person. Common triggers include:   Animal dander.  Dust mites.  Cockroaches.  Pollen from trees or grass.  Mold.  Smoke.  Air pollutants such as dust, household cleaners, hair sprays, aerosol sprays, paint fumes, strong chemicals, or strong odors.  Cold air, weather changes, and winds (which increase molds and pollens in the air).  Strong emotional expressions such as crying or laughing hard.  Stress.  Certain medicines (such as aspirin) or types of drugs (such as beta-blockers).  Sulfites in foods and drinks. Foods and drinks that may contain sulfites include dried fruit, potato  chips, and sparkling grape juice.  Infections or inflammatory conditions such as the flu, a cold, or an inflammation of the nasal membranes (rhinitis).  Gastroesophageal reflux disease (GERD).  Exercise or strenuous activity. SYMPTOMS Symptoms may occur immediately after asthma is triggered or many hours later. Symptoms include:  Wheezing.  Excessive nighttime or early morning coughing.  Frequent or severe coughing with a common cold.  Chest tightness.  Shortness of breath. DIAGNOSIS  The diagnosis of asthma is made by a review of your medical history and a physical exam. Tests may also be performed. These may include:  Lung function studies. These tests show how much air you breathe in and out.  Allergy tests.  Imaging tests such as X-rays. TREATMENT  Asthma cannot be cured, but it can usually be controlled. Treatment involves identifying and avoiding your asthma triggers. It also involves medicines. There are 2 classes of medicine used for asthma treatment:   Controller medicines. These prevent asthma symptoms from occurring. They are usually taken every day.  Reliever or rescue medicines. These quickly relieve asthma symptoms. They are used as needed and provide short-term relief. Your health care provider will help you create an asthma action plan. An asthma action plan is a written plan for managing and treating your asthma attacks. It includes a list of your asthma triggers and how they may be avoided. It also includes information on when medicines should be taken and when their dosage should be changed. An action plan may also involve the use of a device called a peak flow meter. A peak flow meter measures how well the lungs are working. It helps you monitor your condition. HOME CARE INSTRUCTIONS  Take medicines only as directed by your health care provider. Speak with your health care provider if you have questions about how or when to take the medicines.  Use a peak  flow meter as directed by your health care provider. Record and keep track of readings.  Understand and use the action plan to help minimize or stop an asthma attack without needing to seek medical care.  Control your home environment in the following ways to help prevent asthma attacks:  Do not smoke. Avoid being exposed to secondhand smoke.  Change your heating and air conditioning filter regularly.  Limit your use of fireplaces and wood stoves.  Get rid of pests (such as roaches and mice) and their droppings.  Throw away plants if you see mold on them.  Clean your floors and dust regularly. Use unscented cleaning products.  Try to have someone else vacuum for you regularly. Stay out of rooms while they are being vacuumed and for a short while afterward. If you vacuum, use a dust mask from a hardware store, a double-layered or microfilter vacuum cleaner bag, or a vacuum cleaner with a HEPA filter.  Replace carpet with wood, tile, or vinyl flooring. Carpet can trap dander and dust.  Use allergy-proof pillows, mattress covers, and box spring covers.  Wash bed sheets and blankets every week in hot water and dry them in a dryer.  Use blankets that are made of polyester or cotton.  Clean bathrooms and kitchens with bleach. If possible, have someone repaint the walls in these rooms with mold-resistant paint. Keep out of the rooms that are being cleaned and painted.  Wash hands frequently. SEEK MEDICAL CARE IF:   You have wheezing, shortness of breath, or a cough even if taking medicine to prevent attacks.  The colored mucus you cough up (sputum) is thicker than usual.  Your sputum changes from clear or white to yellow, green, gray, or bloody.  You have any problems that may be related to the medicines you are taking (such as a rash, itching, swelling, or trouble breathing).  You are using a reliever medicine more than 2-3 times per week.  Your peak flow is still at 50-79% of  your personal best after following your action plan for 1 hour.  You have a fever. SEEK IMMEDIATE MEDICAL CARE IF:   You seem to be getting worse and are unresponsive to treatment during an asthma attack.  You are short of breath even at rest.  You get short of breath when doing very little physical activity.  You have difficulty eating, drinking, or talking due to asthma symptoms.  You develop chest pain.  You develop a fast heartbeat.  You have a bluish color to your lips or fingernails.  You are light-headed, dizzy, or faint.  Your peak flow is less than 50% of your personal best. MAKE SURE YOU:   Understand these instructions.  Will watch your condition.  Will get help right away if you are not doing well or get worse. Document Released: 10/22/2005 Document Revised: 03/08/2014 Document Reviewed: 05/21/2013 [facility] Patient Information 2015 [place], [place]. This information is not intended to replace advice given to you by your health care provider. Make sure you discuss any questions you have with your health care provider.

## 2014-11-09 NOTE — ED Notes (Signed)
Patient transported to X-ray 

## 2014-11-09 NOTE — ED Notes (Signed)
Gave patient orange juice

## 2015-02-25 ENCOUNTER — Emergency Department (HOSPITAL_COMMUNITY)
Admission: EM | Admit: 2015-02-25 | Discharge: 2015-02-25 | Disposition: A | Discharge status: Home / Self Care | Payer: Medicaid Other | Attending: Emergency Medicine | Admitting: Emergency Medicine

## 2015-02-25 DIAGNOSIS — S61431A Puncture wound without foreign body of right hand, initial encounter: Secondary | ICD-10-CM | POA: Insufficient documentation

## 2015-02-25 DIAGNOSIS — Y9289 Other specified places as the place of occurrence of the external cause: Secondary | ICD-10-CM | POA: Diagnosis not present

## 2015-02-25 DIAGNOSIS — J45909 Unspecified asthma, uncomplicated: Secondary | ICD-10-CM | POA: Diagnosis not present

## 2015-02-25 DIAGNOSIS — S61432A Puncture wound without foreign body of left hand, initial encounter: Secondary | ICD-10-CM | POA: Insufficient documentation

## 2015-02-25 DIAGNOSIS — Z7951 Long term (current) use of inhaled steroids: Secondary | ICD-10-CM | POA: Diagnosis not present

## 2015-02-25 DIAGNOSIS — S60511A Abrasion of right hand, initial encounter: Secondary | ICD-10-CM | POA: Diagnosis not present

## 2015-02-25 DIAGNOSIS — S60519A Abrasion of unspecified hand, initial encounter: Secondary | ICD-10-CM

## 2015-02-25 DIAGNOSIS — Z87891 Personal history of nicotine dependence: Secondary | ICD-10-CM | POA: Insufficient documentation

## 2015-02-25 DIAGNOSIS — S61439A Puncture wound without foreign body of unspecified hand, initial encounter: Secondary | ICD-10-CM

## 2015-02-25 DIAGNOSIS — S61459A Open bite of unspecified hand, initial encounter: Secondary | ICD-10-CM

## 2015-02-25 DIAGNOSIS — Y998 Other external cause status: Secondary | ICD-10-CM | POA: Diagnosis not present

## 2015-02-25 DIAGNOSIS — S60512A Abrasion of left hand, initial encounter: Secondary | ICD-10-CM | POA: Diagnosis not present

## 2015-02-25 DIAGNOSIS — W5501XA Bitten by cat, initial encounter: Secondary | ICD-10-CM | POA: Diagnosis not present

## 2015-02-25 DIAGNOSIS — Z79899 Other long term (current) drug therapy: Secondary | ICD-10-CM | POA: Diagnosis not present

## 2015-02-25 DIAGNOSIS — Y9389 Activity, other specified: Secondary | ICD-10-CM | POA: Diagnosis not present

## 2015-02-25 DIAGNOSIS — Z23 Encounter for immunization: Secondary | ICD-10-CM | POA: Diagnosis not present

## 2015-02-25 MED ORDER — BACITRACIN 500 UNIT/GM EX OINT
4.0000 "application " | TOPICAL_OINTMENT | Freq: Once | CUTANEOUS | Status: AC
  Administered 2015-02-25: 4 via TOPICAL
  Filled 2015-02-25: qty 3.6

## 2015-02-25 MED ORDER — AMOXICILLIN-POT CLAVULANATE 875-125 MG PO TABS: 1.0000 | ORAL_TABLET | Freq: Two times a day (BID) | ORAL | Status: DC

## 2015-02-25 MED ORDER — RABIES VACCINE, PCEC IM SUSR
1.0000 mL | Freq: Once | INTRAMUSCULAR | Status: AC
  Administered 2015-02-25: 1 mL via INTRAMUSCULAR
  Filled 2015-02-25: qty 1

## 2015-02-25 MED ORDER — HYDROCODONE-ACETAMINOPHEN 5-325 MG PO TABS
1.0000 | ORAL_TABLET | Freq: Once | ORAL | Status: AC
  Administered 2015-02-25: 1 via ORAL
  Filled 2015-02-25: qty 1

## 2015-02-25 MED ORDER — AMOXICILLIN-POT CLAVULANATE 875-125 MG PO TABS
1.0000 | ORAL_TABLET | Freq: Once | ORAL | Status: AC
  Administered 2015-02-25: 1 via ORAL
  Filled 2015-02-25: qty 1

## 2015-02-25 MED ORDER — TETANUS-DIPHTH-ACELL PERTUSSIS 5-2.5-18.5 LF-MCG/0.5 IM SUSP
0.5000 mL | Freq: Once | INTRAMUSCULAR | Status: AC
  Administered 2015-02-25: 0.5 mL via INTRAMUSCULAR
  Filled 2015-02-25: qty 0.5

## 2015-02-25 MED ORDER — RABIES IMMUNE GLOBULIN 150 UNIT/ML IM INJ
20.0000 [IU]/kg | INJECTION | Freq: Once | INTRAMUSCULAR | Status: AC
  Administered 2015-02-25: 1725 [IU] via INTRAMUSCULAR
  Filled 2015-02-25: qty 11.5

## 2015-02-25 MED ORDER — HYDROCODONE-ACETAMINOPHEN 5-325 MG PO TABS: ORAL_TABLET | ORAL | Status: DC

## 2015-02-25 NOTE — ED Provider Notes (Signed)
CSN: [id number]     Arrival date & time 02/25/15  1803 History  This chart was scribed for non-physician practitioner Wynetta Emery, PA working with Margarita Grizzle, MD, by Tanda Rockers, ED Scribe. This patient was seen in room TR02C/TR02C and the patient's care was started at 6:31 PM.    Chief Complaint  Patient presents with  . Animal Bite   The history is provided by the patient. No language interpreter was used.     HPI Comments: Jason Roy is a 34 y.o. male brought in by ambulance, who presents to the Emergency Department complaining of cat bite that occurred earlier today. Pt accidentally let a woman's cat out of the house and was trying to catch it when it bit and scratched him on bilateral arms and hands. Pt is now unsure whether the car was the woman's cat or a random outside cat. He denies any other symptoms. Pt is not UTD on tetanus. Pain is minimal.   Past Medical History  Diagnosis Date  . Asthma    No past surgical history on file. Family History  Problem Relation Age of Onset  . Asthma Mother    History  Substance Use Topics  . Smoking status: Former Games developer  . Smokeless tobacco: Not on file  . Alcohol Use: No    Review of Systems  A complete 10 system review of systems was obtained and all systems are negative except as noted in the HPI and PMH.    Allergies  Review of patient's allergies indicates no known allergies.  Home Medications   Prior to Admission medications   Medication Sig Start Date End Date Taking? Authorizing Provider  albuterol (PROVENTIL HFA;VENTOLIN HFA) 108 (90 BASE) MCG/ACT inhaler Inhale 2 puffs into the lungs every 6 (six) hours as needed for wheezing.    Historical Provider, MD  beclomethasone (QVAR) 80 MCG/ACT inhaler Inhale 1 puff into the lungs 2 (two) times daily.    Historical Provider, MD  benzonatate (TESSALON) 100 MG capsule Take 1 capsule (100 mg total) by mouth every 8 (eight) hours. 11/09/14   Oswaldo Conroy, PA-C   guaiFENesin (MUCINEX) 600 MG 12 hr tablet Take 600 mg by mouth 2 (two) times daily.    Historical Provider, MD  HYDROcodone-acetaminophen (HYCET) 7.5-325 mg/15 ml solution Take 15 mLs by mouth every 4 (four) hours as needed for moderate pain or severe pain. Patient not taking: Reported on 11/09/2014 08/05/14   Kathrynn Speed, PA-C  ibuprofen (ADVIL,MOTRIN) 800 MG tablet Take 1 tablet (800 mg total) by mouth every 8 (eight) hours as needed for pain. 05/19/13   Peter Dammen, PA-C  montelukast (SINGULAIR) 10 MG tablet Take 10 mg by mouth at bedtime.    Historical Provider, MD  predniSONE (DELTASONE) 20 MG tablet 2 tabs po daily x 5 days 11/09/14   Oswaldo Conroy, PA-C   Triage Vitals: BP 153/109 mmHg  Pulse 90  Temp(Src) 99.4 F (37.4 C) (Oral)  Resp 20  Ht  (1.753 m)  Wt 193 lb (87.544 kg)  BMI 28.49 kg/m2  SpO2 98%   Physical Exam  Constitutional: He is oriented to person, place, and time. He appears well-developed and well-nourished. No distress.  HENT:  Head: Normocephalic and atraumatic.  Eyes: Conjunctivae and EOM are normal.  Neck: Neck supple. No tracheal deviation present.  Cardiovascular: Normal rate.   Pulmonary/Chest: Effort normal. No stridor. No respiratory distress.  Musculoskeletal: Normal range of motion.  Neurological: He is alert and oriented to  person, place, and time.  Skin: Skin is warm and dry.  Multiple abrasions and puncture wounds to bilateral hands on the dorsum there is a small, 2 cm linear abrasion to the volar side of the right wrist.  Psychiatric: He has a normal mood and affect. His behavior is normal.  Nursing note and vitals reviewed.   ED Course  Procedures (including critical care time)  DIAGNOSTIC STUDIES: Oxygen Saturation is 98% on RA, normal by my interpretation.    COORDINATION OF CARE: 6:47 PM-Discussed treatment plan which includes rabies vaccination with pt at bedside and pt agreed to plan.   Labs Review Labs Reviewed - No data to  display  Imaging Review No results found.   EKG Interpretation None      MDM   Final diagnoses:  Cat bite of hand, unspecified laterality, initial encounter  Puncture wound, hand, unspecified laterality, initial encounter  Abrasion, hand, unspecified laterality, initial encounter    Filed Vitals:   02/25/15 1808  BP: 153/109  Pulse: 90  Temp: 99.4 F (37.4 C)  TempSrc: Oral  Resp: 20  Height: 5\' 9"  (1.753 m)  Weight: 193 lb (87.544 kg)  SpO2: 98%    Medications  rabies vaccine (RABAVERT) injection 1 mL (not administered)  rabies immune globulin (HYPERAB) injection 1,725 Units (not administered)  Tdap (BOOSTRIX) injection 0.5 mL (0.5 mLs Intramuscular Given 02/25/15 1951)  amoxicillin-clavulanate (AUGMENTIN) 875-125 MG per tablet 1 tablet (1 tablet Oral Given 02/25/15 1951)  HYDROcodone-acetaminophen (NORCO/VICODIN) 5-325 MG per tablet 1 tablet (1 tablet Oral Given 02/25/15 1951)    Jason Roy is a pleasant 34 y.o. male presenting with multiple abrasions and puncture wounds to cat bite and scratch occurring to hands just prior to arrival. Cat is unknown to him, recommend rabies vaccination. We'll give him Augmentin and update the tetanus as well. 11.5 mL of rabies IgG injected into the bilateral hands. We've had an extensive discussion on how to present to the urgent care center for boosters.  Evaluation does not show pathology that would require ongoing emergent intervention or inpatient treatment. Pt is hemodynamically stable and mentating appropriately. Discussed findings and plan with patient/guardian, who agrees with care plan. All questions answered. Return precautions discussed and outpatient follow up given.   Discharge Medication List as of 02/25/2015  8:50 PM    START taking these medications   Details  amoxicillin-clavulanate (AUGMENTIN) 875-125 MG per tablet Take 1 tablet by mouth every 12 (twelve) hours., Starting 02/25/2015, Until Discontinued, Print     HYDROcodone-acetaminophen (NORCO/VICODIN) 5-325 MG per tablet Take 1-2 tablets by mouth every 6 hours as needed for pain and/or cough., Print         I personally performed the services described in this documentation, which was scribed in my presence. The recorded information has been reviewed and is accurate.      Wynetta Emeryicole Esti Demello, PA-C 02/25/15 [id number]  Margarita Grizzleanielle Ray, MD 02/26/15 [phone]

## 2015-02-25 NOTE — Discharge Instructions (Signed)
Take your antibiotics as directed and to completion. You should never have any leftover antibiotics! Push fluids and stay well hydrated.   If you see signs of infection (warmth, redness, tenderness, pus, sharp increase in pain, fever, red streaking) immediately return to the emergency department.  Take vicodin for breakthrough pain, do not drink alcohol, drive, care for children or do other critical tasks while taking vicodin.  You need a rabies booster shots on the following days: Date 02/28/2015     To:  Urgent Care  Date 03/04/2015    To:  Urgent Care  Date 03/11/2015   To:  Urgent Care  Wash the affected area with soap and water and apply a thin layer of topical antibiotic ointment. Do this every 12 hours.   Do not use rubbing alcohol or hydrogen peroxide.                        Look for signs of infection: if you see redness, if the area becomes warm, if pain increases sharply, there is discharge (pus), if red streaks appear or you develop fever or vomiting, RETURN immediately to the Emergency Department  for a recheck.    Animal Bite An animal bite can result in a scratch on the skin, deep open cut, puncture of the skin, crush injury, or tearing away of the skin or a body part. Dogs are responsible for most animal bites. Children are bitten more often than adults. An animal bite can range from very mild to more serious. A small bite from your house pet is no cause for alarm. However, some animal bites can become infected or injure a bone or other tissue. You must seek medical care if:  The skin is broken and bleeding does not slow down or stop after 15 minutes.  The puncture is deep and difficult to clean (such as a cat bite).  Pain, warmth, redness, or pus develops around the wound.  The bite is from a stray animal or rodent. There may be a risk of rabies infection.  The bite is from a snake, raccoon, skunk, fox, coyote, or bat. There may be a risk of rabies infection.  The  person bitten has a chronic illness such as diabetes, liver disease, or cancer, or the person takes medicine that lowers the immune system.  There is concern about the location and severity of the bite. It is important to clean and protect an animal bite wound right away to prevent infection. Follow these steps:  Clean the wound with plenty of water and soap.  Apply an antibiotic cream.  Apply gentle pressure over the wound with a clean towel or gauze to slow or stop bleeding.  Elevate the affected area above the heart to help stop any bleeding.  Seek medical care. Getting medical care within 8 hours of the animal bite leads to the best possible outcome. DIAGNOSIS  Your caregiver will most likely:  Take a detailed history of the animal and the bite injury.  Perform a wound exam.  Take your medical history. Blood tests or X-rays may be performed. Sometimes, infected bite wounds are cultured and sent to a lab to identify the infectious bacteria.  TREATMENT  Medical treatment will depend on the location and type of animal bite as well as the patient's medical history. Treatment may include:  Wound care, such as cleaning and flushing the wound with saline solution, bandaging, and elevating the affected area.  Antibiotics.  Tetanus  immunization.  Rabies immunization.  Leaving the wound open to heal. This is often done with animal bites, due to the high risk of infection. However, in certain cases, wound closure with stitches, wound adhesive, skin adhesive strips, or staples may be used. Infected bites that are left untreated may require intravenous (IV) antibiotics and surgical treatment in the hospital. HOME CARE INSTRUCTIONS  Follow your caregiver's instructions for wound care.  Take all medicines as directed.  If your caregiver prescribes antibiotics, take them as directed. Finish them even if you start to feel better.  Follow up with your caregiver for further exams or  immunizations as directed. You may need a tetanus shot if:  You cannot remember when you had your last tetanus shot.  You have never had a tetanus shot.  The injury broke your skin. If you get a tetanus shot, your arm may swell, get red, and feel warm to the touch. This is common and not a problem. If you need a tetanus shot and you choose not to have one, there is a rare chance of getting tetanus. Sickness from tetanus can be serious. SEEK MEDICAL CARE IF:  You notice warmth, redness, soreness, swelling, pus discharge, or a bad smell coming from the wound.  You have a red line on the skin coming from the wound.  You have a fever, chills, or a general ill feeling.  You have nausea or vomiting.  You have continued or worsening pain.  You have trouble moving the injured part.  You have other questions or concerns. MAKE SURE YOU:  Understand these instructions.  Will watch your condition.  Will get help right away if you are not doing well or get worse. Document Released: 07/10/2011 Document Revised: 01/14/2012 Document Reviewed: 07/10/2011 [facility] Patient Information 2015 [place], [place]. This information is not intended to replace advice given to you by your health care provider. Make sure you discuss any questions you have with your health care provider.

## 2015-02-25 NOTE — ED Notes (Signed)
Pt to ED via [facility] after reported being attacked by a cat.  Pt st's he was trying to catch the cat for a friend and the cat bit and scratched him on bil hand and arms

## 2015-02-25 NOTE — ED Notes (Signed)
Patient is alert and orientedx4.  Patient was explained discharge instructions and they understood them with no questions.  The patient is riding the bus.

## 2015-02-28 ENCOUNTER — Encounter (HOSPITAL_COMMUNITY): Payer: Self-pay | Admitting: Emergency Medicine

## 2015-02-28 ENCOUNTER — Emergency Department (INDEPENDENT_AMBULATORY_CARE_PROVIDER_SITE_OTHER)
Admission: EM | Admit: 2015-02-28 | Discharge: 2015-02-28 | Disposition: A | Discharge status: Home / Self Care | Payer: Medicaid Other | Source: Home / Self Care

## 2015-02-28 DIAGNOSIS — Z203 Contact with and (suspected) exposure to rabies: Secondary | ICD-10-CM | POA: Diagnosis not present

## 2015-02-28 MED ORDER — RABIES VACCINE, PCEC IM SUSR
INTRAMUSCULAR | Status: AC
  Filled 2015-02-28: qty 1

## 2015-02-28 MED ORDER — RABIES VACCINE, PCEC IM SUSR
1.0000 mL | Freq: Once | INTRAMUSCULAR | Status: AC
  Administered 2015-02-28: 1 mL via INTRAMUSCULAR

## 2015-02-28 NOTE — ED Notes (Signed)
Patient here for his second Rabies injection.  Pt reports no problems.

## 2015-03-04 ENCOUNTER — Emergency Department (INDEPENDENT_AMBULATORY_CARE_PROVIDER_SITE_OTHER)
Admission: EM | Admit: 2015-03-04 | Discharge: 2015-03-04 | Disposition: A | Discharge status: Home / Self Care | Payer: Medicaid Other | Source: Home / Self Care

## 2015-03-04 DIAGNOSIS — Z203 Contact with and (suspected) exposure to rabies: Secondary | ICD-10-CM | POA: Diagnosis not present

## 2015-03-04 MED ORDER — RABIES VACCINE, PCEC IM SUSR
INTRAMUSCULAR | Status: AC
  Filled 2015-03-04: qty 1

## 2015-03-04 MED ORDER — RABIES VACCINE, PCEC IM SUSR
1.0000 mL | Freq: Once | INTRAMUSCULAR | Status: AC
  Administered 2015-03-04: 1 mL via INTRAMUSCULAR

## 2015-03-04 NOTE — ED Notes (Signed)
Here for rabies shot #3 in series. Reports no problems

## 2015-03-11 ENCOUNTER — Encounter (HOSPITAL_COMMUNITY): Payer: Self-pay | Admitting: *Deleted

## 2015-03-11 ENCOUNTER — Emergency Department (INDEPENDENT_AMBULATORY_CARE_PROVIDER_SITE_OTHER)
Admission: EM | Admit: 2015-03-11 | Discharge: 2015-03-11 | Disposition: A | Discharge status: Home / Self Care | Payer: Self-pay | Source: Home / Self Care

## 2015-03-11 DIAGNOSIS — Z203 Contact with and (suspected) exposure to rabies: Secondary | ICD-10-CM

## 2015-03-11 MED ORDER — RABIES VACCINE, PCEC IM SUSR
1.0000 mL | Freq: Once | INTRAMUSCULAR | Status: AC
  Administered 2015-03-11: 1 mL via INTRAMUSCULAR

## 2015-03-11 MED ORDER — RABIES VACCINE, PCEC IM SUSR
INTRAMUSCULAR | Status: AC
  Filled 2015-03-11: qty 1

## 2015-03-11 NOTE — ED Notes (Signed)
Here  For  The  Last  In his  Series  Of  Rabies  Injections      Verbalizes  No  Complaints

## 2015-06-14 ENCOUNTER — Emergency Department (HOSPITAL_COMMUNITY)
Admission: EM | Admit: 2015-06-14 | Discharge: 2015-06-14 | Disposition: A | Discharge status: Home / Self Care | Payer: Medicaid Other | Attending: Emergency Medicine | Admitting: Emergency Medicine

## 2015-06-14 DIAGNOSIS — Z7951 Long term (current) use of inhaled steroids: Secondary | ICD-10-CM | POA: Diagnosis not present

## 2015-06-14 DIAGNOSIS — J069 Acute upper respiratory infection, unspecified: Secondary | ICD-10-CM | POA: Diagnosis not present

## 2015-06-14 DIAGNOSIS — R0981 Nasal congestion: Secondary | ICD-10-CM | POA: Diagnosis present

## 2015-06-14 DIAGNOSIS — Z87891 Personal history of nicotine dependence: Secondary | ICD-10-CM | POA: Diagnosis not present

## 2015-06-14 DIAGNOSIS — J45909 Unspecified asthma, uncomplicated: Secondary | ICD-10-CM | POA: Insufficient documentation

## 2015-06-14 DIAGNOSIS — Z79899 Other long term (current) drug therapy: Secondary | ICD-10-CM | POA: Diagnosis not present

## 2015-06-14 NOTE — ED Notes (Signed)
Pt with Hx of asthma c/o nasal drainage onset 2 days ago followed by sinus pain, dry mouth, coughing, lightheadedness, periorbital edema. Pt states he has been taking sudafed, nyquil, dayquil, mucinex, miscellaneous allergy pills without relief.

## 2015-06-14 NOTE — ED Notes (Signed)
MD in to see patient. Repeatedly informed patient that there were no antibiotics given for viruses. Pt became angry and began to curse loudly at MD and nearby staff. Making comments like "Man, fuck this hospital. Fuck these people. I don't feel well, and this is how you treat me?! I'm never coming back to this hospital. Stupid bitch." Then he proceeded to walk out of the room, cursing loudly at surrounding staff members while leaving.

## 2015-06-14 NOTE — ED Provider Notes (Signed)
CSN: [id number]     Arrival date & time 06/14/15  0102 History   First MD Initiated Contact with Patient 06/14/15 [phone]     Chief Complaint  Patient presents with  . Cough  . Nasal Congestion     (Consider location/radiation/quality/duration/timing/severity/associated sxs/prior Treatment) HPI Comments: 34 year old male with a history of asthma who presents with nasal drainage, sinus pain, and cough. The patient states that 2 days ago he began having nasal drainage and sinus pressure. He later developed a dry cough that is worse at night. He has had occasional sinus headache and reports that he feels weak. He has been taking multiple over-the-counter medications including Mucinex, Sudafed, DayQuil with no relief. He does report a dry mouth for which he has been drinking a lot of fluids. No fevers. No shortness of breath or wheezing. He reports that he woke up and feels like he has puffy eyes this morning.  Patient is a 34 y.o. male presenting with cough. The history is provided by the patient.  Cough   Past Medical History  Diagnosis Date  . Asthma    No past surgical history on file. Family History  Problem Relation Age of Onset  . Asthma Mother    History  Substance Use Topics  . Smoking status: Former Games developer  . Smokeless tobacco: Not on file  . Alcohol Use: No    Review of Systems  Respiratory: Positive for cough.    10 Systems reviewed and are negative for acute change except as noted in the HPI.    Allergies  Review of patient's allergies indicates no known allergies.  Home Medications   Prior to Admission medications   Medication Sig Start Date End Date Taking? Authorizing Provider  albuterol (PROVENTIL HFA;VENTOLIN HFA) 108 (90 BASE) MCG/ACT inhaler Inhale 2 puffs into the lungs every 6 (six) hours as needed for wheezing.   Yes Historical Provider, MD  albuterol (PROVENTIL) (5 MG/ML) 0.5% nebulizer solution Inhale 2.5 mg into the lungs every 4 (four) hours as  needed for wheezing or shortness of breath.  08/10/14  Yes Historical Provider, MD  beclomethasone (QVAR) 80 MCG/ACT inhaler Inhale 1 puff into the lungs 2 (two) times daily.   Yes Historical Provider, MD  guaiFENesin (MUCINEX) 600 MG 12 hr tablet Take 600 mg by mouth 2 (two) times daily as needed for cough or to loosen phlegm.    Yes Historical Provider, MD  lisinopril (PRINIVIL,ZESTRIL) 10 MG tablet Take 10 mg by mouth daily. 05/31/15  Yes Historical Provider, MD  montelukast (SINGULAIR) 10 MG tablet Take 10 mg by mouth at bedtime.   Yes Historical Provider, MD  phenylephrine (SUDAFED PE) 10 MG TABS tablet Take 10 mg by mouth every 4 (four) hours as needed (congestion).   Yes Historical Provider, MD  pravastatin (PRAVACHOL) 80 MG tablet Take 80 mg by mouth every evening. 05/31/15  Yes Historical Provider, MD  Pseudoephedrine-APAP-DM (DAYQUIL PO) Take 30 mLs by mouth every 8 (eight) hours as needed (cold symptoms).   Yes Historical Provider, MD  amoxicillin-clavulanate (AUGMENTIN) 875-125 MG per tablet Take 1 tablet by mouth every 12 (twelve) hours. Patient not taking: Reported on 06/14/2015 02/25/15   Joni Reining Pisciotta, PA-C  HYDROcodone-acetaminophen (NORCO/VICODIN) 5-325 MG per tablet Take 1-2 tablets by mouth every 6 hours as needed for pain and/or cough. Patient not taking: Reported on 06/14/2015 02/25/15   Joni Reining Pisciotta, PA-C   BP 130/94 mmHg  Pulse 95  Temp(Src) 98 F (36.7 C) (Oral)  Resp 18  SpO2 93% Physical Exam  Constitutional: He is oriented to person, place, and time. He appears well-developed and well-nourished. No distress.  HENT:  Head: Normocephalic and atraumatic.  Moist mucous membranes  Eyes: Conjunctivae are normal. Pupils are equal, round, and reactive to light.  Neck: Neck supple.  Cardiovascular: Normal rate, regular rhythm and normal heart sounds.   No murmur heard. Pulmonary/Chest: Effort normal and breath sounds normal. No respiratory distress. He has no wheezes.   Abdominal: Soft. Bowel sounds are normal. He exhibits no distension. There is no tenderness.  Musculoskeletal: He exhibits no edema.  Neurological: He is alert and oriented to person, place, and time.  Fluent speech  Skin: Skin is warm and dry.  Psychiatric: He has a normal mood and affect. Judgment normal.  Became agitated and verbally hostile during end of interview  Nursing note and vitals reviewed.   ED Course  Procedures (including critical care time) Labs Review Labs Reviewed - No data to display  Imaging Review No results found.   EKG Interpretation None      MDM   Final diagnoses:  None  viral upper respiratory infection   34 year old male who presents with 2 days of nasal congestion, dry cough, and sinus pressure that has not been relieved by over-the-counter cold medications. At presentation, patient was well-appearing with reassuring vital signs. He was breathing comfortably and had no wheezing on exam. His symptoms are consistent with a viral upper respiratory infection. I suspect that his dry mouth and lightheadedness are side effects of the over-the-counter medications. I instructed the patient on supportive care including sleeping with head of bed elevated, cough suppressant at night, and continued supportive care of viral illness. Patient asked for antibiotic and I explained that it would not improve his viral symptoms. Patient became very agitated and began cursing at me, stating "so I came here for nothing. You're not even helping me. I don't feel well and I need medicine." I explained that no medication would shorten the duration of viral symptoms and I explained that the patient has no wheezing or respiratory abnormalities on exam, so he does not currently need steroids for asthma. He denies CP or SOB. I had already reviewed reasons to return including shortness of breath or wheezing. Patient became very agitated and walked out with police escort, refusing to take  any discharge paperwork.   Laurence Spates, MD 06/14/15 [phone]

## 2015-09-25 ENCOUNTER — Emergency Department (HOSPITAL_COMMUNITY)
Admission: EM | Admit: 2015-09-25 | Discharge: 2015-09-25 | Disposition: A | Discharge status: Home / Self Care | Payer: Medicaid Other | Attending: Emergency Medicine | Admitting: Emergency Medicine

## 2015-09-25 ENCOUNTER — Encounter (HOSPITAL_COMMUNITY): Payer: Self-pay

## 2015-09-25 DIAGNOSIS — Z7951 Long term (current) use of inhaled steroids: Secondary | ICD-10-CM | POA: Insufficient documentation

## 2015-09-25 DIAGNOSIS — J069 Acute upper respiratory infection, unspecified: Secondary | ICD-10-CM | POA: Insufficient documentation

## 2015-09-25 DIAGNOSIS — R05 Cough: Secondary | ICD-10-CM | POA: Diagnosis present

## 2015-09-25 DIAGNOSIS — Z79899 Other long term (current) drug therapy: Secondary | ICD-10-CM | POA: Insufficient documentation

## 2015-09-25 DIAGNOSIS — J45909 Unspecified asthma, uncomplicated: Secondary | ICD-10-CM | POA: Diagnosis not present

## 2015-09-25 MED ORDER — CETIRIZINE HCL 10 MG PO TABS: 10.0000 mg | ORAL_TABLET | Freq: Every day | ORAL | Status: AC

## 2015-09-25 MED ORDER — BENZONATATE 100 MG PO CAPS: 100.0000 mg | ORAL_CAPSULE | Freq: Three times a day (TID) | ORAL | Status: AC

## 2015-09-25 MED ORDER — FLUTICASONE PROPIONATE 50 MCG/ACT NA SUSP: 2.0000 | Freq: Every day | NASAL | Status: AC

## 2015-09-25 NOTE — ED Provider Notes (Signed)
CSN: [id number]     Arrival date & time 09/25/15  1255 History   First MD Initiated Contact with Patient 09/25/15 1315     Chief Complaint  Patient presents with  . Cough   Jason Roy is a 34 y.o. male with a history of asthma who presents to the emergency department complaining of 2 days of nasal congestion, runny nose, postnasal drip, sneezing, and cough. He reports his post nasal drip is causing him to cough. He has taken nothing for treatment today. He reports he has an albuterol inhaler at home that he uses but he has not needed to use it lately and denies wheezing. He denies fevers, chest pain, shortness of breath, abdominal pain, nausea, vomiting, headaches, syncope, ear discharge, sore throat or trouble swallowing.  (Consider location/radiation/quality/duration/timing/severity/associated sxs/prior Treatment) HPI  Past Medical History  Diagnosis Date  . Asthma    History reviewed. No pertinent past surgical history. Family History  Problem Relation Age of Onset  . Asthma Mother    Social History  Substance Use Topics  . Smoking status: Former Games developer  . Smokeless tobacco: None  . Alcohol Use: No    Review of Systems  Constitutional: Negative for fever and appetite change.  HENT: Positive for postnasal drip, rhinorrhea and sneezing. Negative for congestion, ear discharge, hearing loss, mouth sores, nosebleeds, sore throat and trouble swallowing.   Eyes: Negative for visual disturbance.  Respiratory: Positive for cough. Negative for shortness of breath and wheezing.   Cardiovascular: Negative for chest pain.  Gastrointestinal: Negative for nausea, vomiting, abdominal pain and diarrhea.  Genitourinary: Negative for dysuria.  Musculoskeletal: Negative for back pain, arthralgias and neck pain.  Skin: Negative for rash.  Neurological: Negative for syncope, light-headedness and headaches.      Allergies  Review of patient's allergies indicates no known  allergies.  Home Medications   Prior to Admission medications   Medication Sig Start Date End Date Taking? Authorizing Provider  albuterol (PROVENTIL HFA;VENTOLIN HFA) 108 (90 BASE) MCG/ACT inhaler Inhale 2 puffs into the lungs every 6 (six) hours as needed for wheezing.    Historical Provider, MD  albuterol (PROVENTIL) (5 MG/ML) 0.5% nebulizer solution Inhale 2.5 mg into the lungs every 4 (four) hours as needed for wheezing or shortness of breath.  08/10/14   Historical Provider, MD  beclomethasone (QVAR) 80 MCG/ACT inhaler Inhale 1 puff into the lungs 2 (two) times daily.    Historical Provider, MD  benzonatate (TESSALON) 100 MG capsule Take 1 capsule (100 mg total) by mouth every 8 (eight) hours. 09/25/15   Everlene Farrier, PA-C  cetirizine (ZYRTEC ALLERGY) 10 MG tablet Take 1 tablet (10 mg total) by mouth daily. 09/25/15   Everlene Farrier, PA-C  fluticasone (FLONASE) 50 MCG/ACT nasal spray Place 2 sprays into both nostrils daily. 09/25/15   Everlene Farrier, PA-C  guaiFENesin (MUCINEX) 600 MG 12 hr tablet Take 600 mg by mouth 2 (two) times daily as needed for cough or to loosen phlegm.     Historical Provider, MD  lisinopril (PRINIVIL,ZESTRIL) 10 MG tablet Take 10 mg by mouth daily. 05/31/15   Historical Provider, MD  montelukast (SINGULAIR) 10 MG tablet Take 10 mg by mouth at bedtime.    Historical Provider, MD  phenylephrine (SUDAFED PE) 10 MG TABS tablet Take 10 mg by mouth every 4 (four) hours as needed (congestion).    Historical Provider, MD  pravastatin (PRAVACHOL) 80 MG tablet Take 80 mg by mouth every evening. 05/31/15   Historical Provider,  MD  Pseudoephedrine-APAP-DM (DAYQUIL PO) Take 30 mLs by mouth every 8 (eight) hours as needed (cold symptoms).    Historical Provider, MD   BP 135/105 mmHg  Pulse 88  Temp(Src) 98.5 F (36.9 C) (Oral)  Resp 18  SpO2 100% Physical Exam  Constitutional: He appears well-developed and well-nourished. No distress.  Nontoxic appearing.  HENT:  Head:  Normocephalic and atraumatic.  Right Ear: External ear normal.  Left Ear: External ear normal.  Mouth/Throat: Oropharynx is clear and moist. No oropharyngeal exudate.  Bilateral tympanic membranes are pearly-gray without erythema or loss of landmarks.  Boggy nasal turbinates bilaterally. No tonsillar hypertrophy or exudates.  Eyes: Conjunctivae are normal. Pupils are equal, round, and reactive to light. Right eye exhibits no discharge. Left eye exhibits no discharge.  Neck: Normal range of motion. Neck supple.  Cardiovascular: Normal rate, regular rhythm, normal heart sounds and intact distal pulses.   Pulmonary/Chest: Effort normal and breath sounds normal. No respiratory distress. He has no wheezes. He has no rales.  Lungs are clear to auscultation bilaterally.  Abdominal: Soft. There is no tenderness. There is no guarding.  Lymphadenopathy:    He has no cervical adenopathy.  Neurological: He is alert. Coordination normal.  Skin: Skin is warm and dry. No rash noted. He is not diaphoretic. No erythema. No pallor.  Psychiatric: He has a normal mood and affect. His behavior is normal.  Nursing note and vitals reviewed.   ED Course  Procedures (including critical care time) Labs Review Labs Reviewed - No data to display  Imaging Review No results found.    EKG Interpretation None      Filed Vitals:   09/25/15 1305  BP: 135/105  Pulse: 88  Temp: 98.5 F (36.9 C)  TempSrc: Oral  Resp: 18  SpO2: 100%     MDM   Meds given in ED:  Medications - No data to display  Discharge Medication List as of 09/25/2015  1:35 PM    START taking these medications   Details  benzonatate (TESSALON) 100 MG capsule Take 1 capsule (100 mg total) by mouth every 8 (eight) hours., Starting 09/25/2015, Until Discontinued, Print    cetirizine (ZYRTEC ALLERGY) 10 MG tablet Take 1 tablet (10 mg total) by mouth daily., Starting 09/25/2015, Until Discontinued, Print    fluticasone (FLONASE) 50  MCG/ACT nasal spray Place 2 sprays into both nostrils daily., Starting 09/25/2015, Until Discontinued, Print        Final diagnoses:  URI (upper respiratory infection)   This  is a 34 y.o. male with a history of asthma who presents to the emergency department complaining of 2 days of nasal congestion, runny nose, postnasal drip, sneezing, and cough. He reports his post nasal drip is causing him to cough.  On exam the patient is afebrile nontoxic appearing. His lungs are clear to auscultation bilaterally. He has no tonsillar hypertrophy or exudates. He has boggy nasal terminates bilaterally. Patient with upper respiratory infection. We'll discharge with prescriptions for Tessalon Perles, Zyrtec and Flonase. Patient reports he has albuterol inhalers at home and does not need a refill. I encouraged him to have his blood pressure rechecked by primary care. I advised the patient to follow-up with their primary care provider this week. I advised the patient to return to the emergency department with new or worsening symptoms or new concerns. The patient verbalized understanding and agreement with plan.        Everlene FarrierWilliam Allie Gerhold, PA-C 09/25/15 1348  Nelva Nayobert Beaton, MD  09/25/15 1533 

## 2015-09-25 NOTE — ED Notes (Signed)
Onset 2 days nasal congestion, runny nose- thick yellow mucus, productive cough- yellow phlegm and post nasal drip.  No respiratory or swallowing difficulties.

## 2015-09-25 NOTE — Discharge Instructions (Signed)
Upper Respiratory Infection, Adult Most upper respiratory infections (URIs) are a viral infection of the air passages leading to the lungs. A URI affects the nose, throat, and upper air passages. The most common type of URI is nasopharyngitis and is typically referred to as "the common cold." URIs run their course and usually go away on their own. Most of the time, a URI does not require medical attention, but sometimes a bacterial infection in the upper airways can follow a viral infection. This is called a secondary infection. Sinus and middle ear infections are common types of secondary upper respiratory infections. Bacterial pneumonia can also complicate a URI. A URI can worsen asthma and chronic obstructive pulmonary disease (COPD). Sometimes, these complications can require emergency medical care and may be life threatening.  CAUSES Almost all URIs are caused by viruses. A virus is a type of germ and can spread from one person to another.  RISKS FACTORS You may be at risk for a URI if:   You smoke.   You have chronic heart or lung disease.  You have a weakened defense (immune) system.   You are very young or very old.   You have nasal allergies or asthma.  You work in crowded or poorly ventilated areas.  You work in health care facilities or schools. SIGNS AND SYMPTOMS  Symptoms typically develop 2-3 days after you come in contact with a cold virus. Most viral URIs last 7-10 days. However, viral URIs from the influenza virus (flu virus) can last 14-18 days and are typically more severe. Symptoms may include:   Runny or stuffy (congested) nose.   Sneezing.   Cough.   Sore throat.   Headache.   Fatigue.   Fever.   Loss of appetite.   Pain in your forehead, behind your eyes, and over your cheekbones (sinus pain).  Muscle aches.  DIAGNOSIS  Your health care provider may diagnose a URI by:  Physical exam.  Tests to check that your symptoms are not due to  another condition such as:  Strep throat.  Sinusitis.  Pneumonia.  Asthma. TREATMENT  A URI goes away on its own with time. It cannot be cured with medicines, but medicines may be prescribed or recommended to relieve symptoms. Medicines may help:  Reduce your fever.  Reduce your cough.  Relieve nasal congestion. HOME CARE INSTRUCTIONS   Take medicines only as directed by your health care provider.   Gargle warm saltwater or take cough drops to comfort your throat as directed by your health care provider.  Use a warm mist humidifier or inhale steam from a shower to increase air moisture. This may make it easier to breathe.  Drink enough fluid to keep your urine clear or pale yellow.   Eat soups and other clear broths and maintain good nutrition.   Rest as needed.   Return to work when your temperature has returned to normal or as your health care provider advises. You may need to stay home longer to avoid infecting others. You can also use a face mask and careful hand washing to prevent spread of the virus.  Increase the usage of your inhaler if you have asthma.   Do not use any tobacco products, including cigarettes, chewing tobacco, or electronic cigarettes. If you need help quitting, ask your health care provider. PREVENTION  The best way to protect yourself from getting a cold is to practice good hygiene.   Avoid oral or hand contact with people with cold   symptoms.   Wash your hands often if contact occurs.  There is no clear evidence that vitamin C, vitamin E, echinacea, or exercise reduces the chance of developing a cold. However, it is always recommended to get plenty of rest, exercise, and practice good nutrition.  SEEK MEDICAL CARE IF:   You are getting worse rather than better.   Your symptoms are not controlled by medicine.   You have chills.  You have worsening shortness of breath.  You have brown or red mucus.  You have yellow or brown nasal  discharge.  You have pain in your face, especially when you bend forward.  You have a fever.  You have swollen neck glands.  You have pain while swallowing.  You have white areas in the back of your throat. SEEK IMMEDIATE MEDICAL CARE IF:   You have severe or persistent:  Headache.  Ear pain.  Sinus pain.  Chest pain.  You have chronic lung disease and any of the following:  Wheezing.  Prolonged cough.  Coughing up blood.  A change in your usual mucus.  You have a stiff neck.  You have changes in your:  Vision.  Hearing.  Thinking.  Mood. MAKE SURE YOU:   Understand these instructions.  Will watch your condition.  Will get help right away if you are not doing well or get worse.   This information is not intended to replace advice given to you by your health care provider. Make sure you discuss any questions you have with your health care provider.   Document Released: 04/17/2001 Document Revised: 03/08/2015 Document Reviewed: 01/27/2014 Elsevier Interactive Patient Education 2016 [employer]  

## 2016-10-23 IMAGING — CR DG CHEST 2V
2 series · 2 of 2 positions shown · non-contrast
Comparison: 08/05/2014

CLINICAL DATA: Cough starting [REDACTED], fever, shortness of Breath

EXAM:
CHEST  2 VIEW

[chest pa]
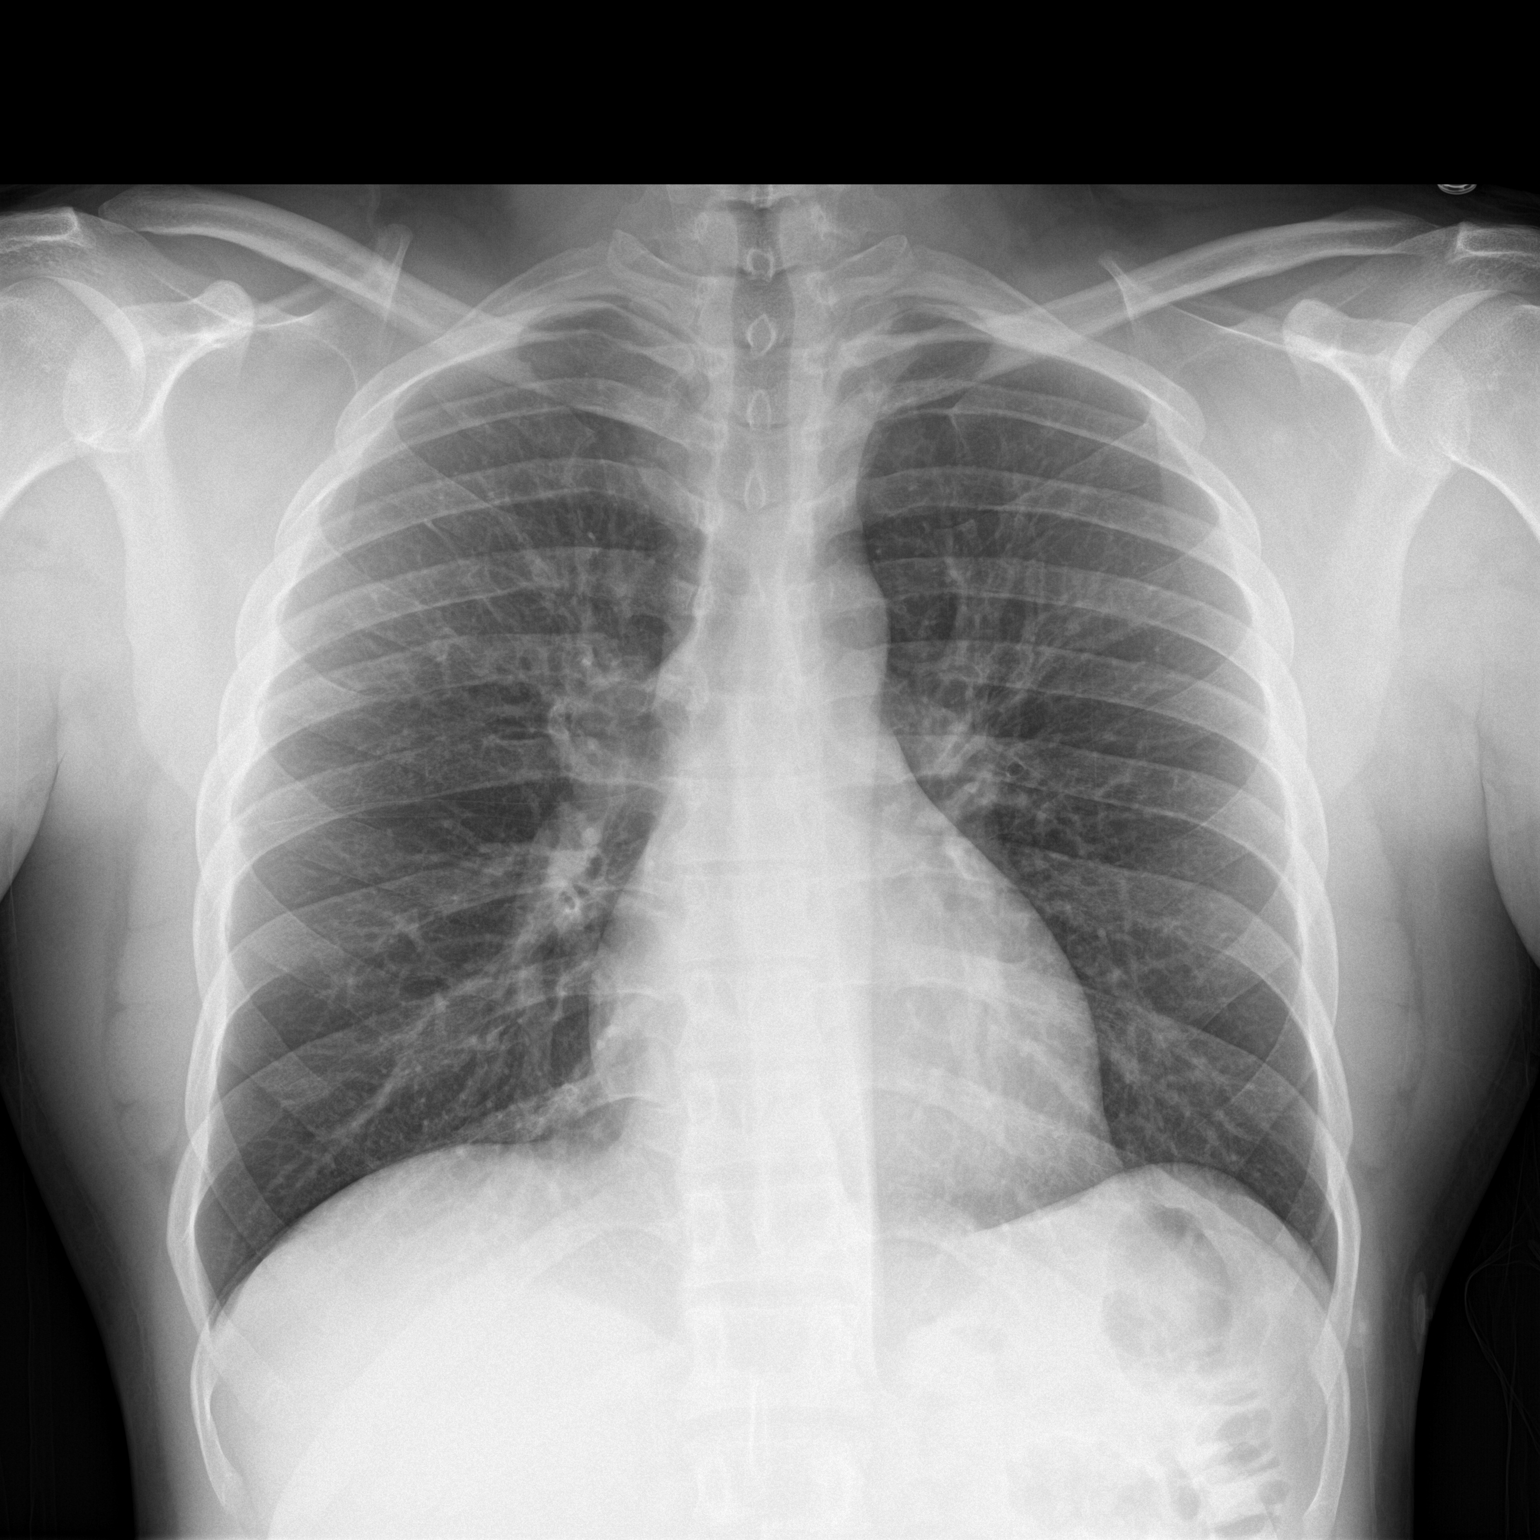

[chest lat]
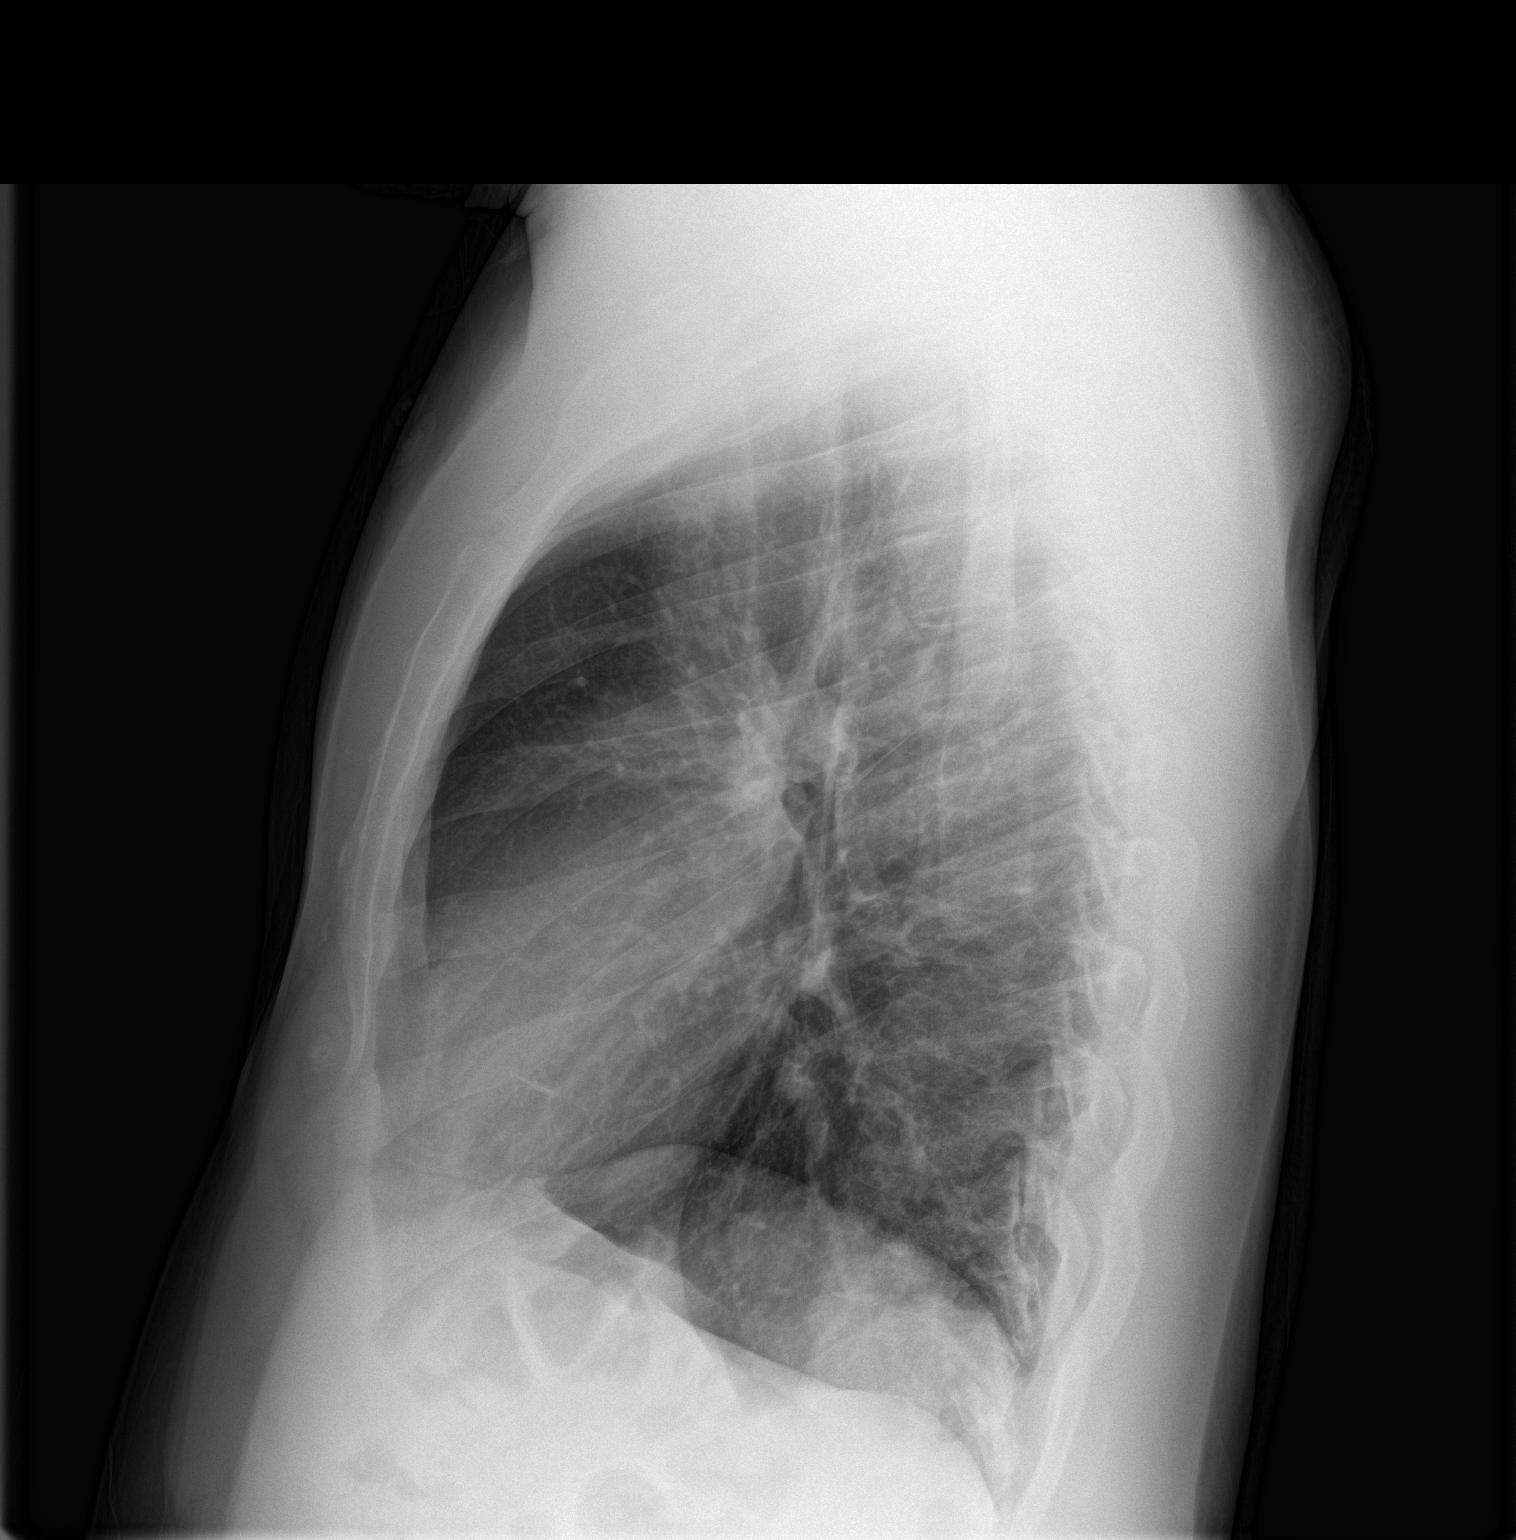

[2 of 2 positions shown; findings below may reference images not displayed]

FINDINGS: Cardiomediastinal silhouette is stable. No acute infiltrate or
pleural effusion. No pulmonary edema. Mild perihilar bronchitic
changes. Bony thorax is unremarkable.
IMPRESSION: No acute infiltrate or pulmonary edema. Mild perihilar bronchitic
changes.

## 2018-07-29 ENCOUNTER — Ambulatory Visit: Payer: Self-pay | Admitting: Family Medicine

## 2019-08-30 ENCOUNTER — Encounter (HOSPITAL_COMMUNITY): Payer: Self-pay

## 2019-08-30 ENCOUNTER — Emergency Department (HOSPITAL_COMMUNITY)
Admission: EM | Admit: 2019-08-30 | Discharge: 2019-08-31 | Disposition: A | Discharge status: Home / Self Care | Payer: Medicaid Other | Attending: Emergency Medicine | Admitting: Emergency Medicine

## 2019-08-30 ENCOUNTER — Other Ambulatory Visit: Payer: Self-pay

## 2019-08-30 DIAGNOSIS — F29 Unspecified psychosis not due to a substance or known physiological condition: Secondary | ICD-10-CM | POA: Insufficient documentation

## 2019-08-30 DIAGNOSIS — F22 Delusional disorders: Secondary | ICD-10-CM | POA: Diagnosis not present

## 2019-08-30 DIAGNOSIS — J45909 Unspecified asthma, uncomplicated: Secondary | ICD-10-CM | POA: Insufficient documentation

## 2019-08-30 DIAGNOSIS — Z20828 Contact with and (suspected) exposure to other viral communicable diseases: Secondary | ICD-10-CM | POA: Insufficient documentation

## 2019-08-30 DIAGNOSIS — F4322 Adjustment disorder with anxiety: Secondary | ICD-10-CM

## 2019-08-30 DIAGNOSIS — Z79899 Other long term (current) drug therapy: Secondary | ICD-10-CM | POA: Diagnosis not present

## 2019-08-30 DIAGNOSIS — Z87891 Personal history of nicotine dependence: Secondary | ICD-10-CM | POA: Insufficient documentation

## 2019-08-30 LAB — CBC WITH DIFFERENTIAL/PLATELET
Abs Immature Granulocytes: 0 10*3/uL (ref 0.00–0.07)
Basophils Absolute: 0 10*3/uL (ref 0.0–0.1)
Basophils Relative: 1 %
Eosinophils Absolute: 0.1 10*3/uL (ref 0.0–0.5)
Eosinophils Relative: 1 %
HCT: 46.3 % (ref 39.0–52.0)
Hemoglobin: 15.3 g/dL (ref 13.0–17.0)
Immature Granulocytes: 0 %
Lymphocytes Relative: 25 %
Lymphs Abs: 1.2 10*3/uL (ref 0.7–4.0)
MCH: 31.2 pg (ref 26.0–34.0)
MCHC: 33 g/dL (ref 30.0–36.0)
MCV: 94.3 fL (ref 80.0–100.0)
Monocytes Absolute: 0.4 10*3/uL (ref 0.1–1.0)
Monocytes Relative: 7 %
Neutro Abs: 3.3 10*3/uL (ref 1.7–7.7)
Neutrophils Relative %: 66 %
Platelets: 248 10*3/uL (ref 150–400)
RBC: 4.91 MIL/uL (ref 4.22–5.81)
RDW: 12.9 % (ref 11.5–15.5)
WBC: 4.9 10*3/uL (ref 4.0–10.5)
nRBC: 0 % (ref 0.0–0.2)

## 2019-08-30 LAB — COMPREHENSIVE METABOLIC PANEL
ALT: 23 U/L (ref 0–44)
AST: 20 U/L (ref 15–41)
Albumin: 4.8 g/dL (ref 3.5–5.0)
Alkaline Phosphatase: 47 U/L (ref 38–126)
Anion gap: 10 (ref 5–15)
BUN: 8 mg/dL (ref 6–20)
CO2: 25 mmol/L (ref 22–32)
Calcium: 9.4 mg/dL (ref 8.9–10.3)
Chloride: 104 mmol/L (ref 98–111)
Creatinine, Ser: 0.92 mg/dL (ref 0.61–1.24)
GFR calc Af Amer: >60 mL/min (ref >60)
GFR calc non Af Amer: >60 mL/min (ref >60)
Glucose, Bld: 81 mg/dL (ref 70–99)
Potassium: 4.1 mmol/L (ref 3.5–5.1)
Sodium: 139 mmol/L (ref 135–145)
Total Bilirubin: 0.9 mg/dL (ref 0.3–1.2)
Total Protein: 7.8 g/dL (ref 6.5–8.1)

## 2019-08-30 LAB — RAPID URINE DRUG SCREEN, HOSP PERFORMED
Amphetamines: NOT DETECTED
Barbiturates: NOT DETECTED
Benzodiazepines: NOT DETECTED
Cocaine: NOT DETECTED
Opiates: NOT DETECTED
Tetrahydrocannabinol: POSITIVE — AB

## 2019-08-30 LAB — ETHANOL: Alcohol, Ethyl (B): <10 mg/dL (ref <10)

## 2019-08-30 MED ORDER — ALBUTEROL SULFATE HFA 108 (90 BASE) MCG/ACT IN AERS: 2.0000 | INHALATION_SPRAY | Freq: Four times a day (QID) | RESPIRATORY_TRACT | Status: DC | PRN

## 2019-08-30 MED ORDER — PRAVASTATIN SODIUM 20 MG PO TABS
80.0000 mg | ORAL_TABLET | Freq: Every evening | ORAL | Status: DC
  Administered 2019-08-30: 19:00:00 80 mg via ORAL
  Filled 2019-08-30: qty 4

## 2019-08-30 MED ORDER — LISINOPRIL 10 MG PO TABS
10.0000 mg | ORAL_TABLET | Freq: Every day | ORAL | Status: DC
  Administered 2019-08-30 – 2019-08-31 (×2): 10 mg via ORAL
  Filled 2019-08-30 (×2): qty 1

## 2019-08-30 MED ORDER — BECLOMETHASONE DIPROP HFA 40 MCG/ACT IN AERB
2.0000 | INHALATION_SPRAY | Freq: Two times a day (BID) | RESPIRATORY_TRACT | Status: DC
  Administered 2019-08-30 – 2019-08-31 (×2): 2 via RESPIRATORY_TRACT
  Filled 2019-08-30: qty 10.6

## 2019-08-30 MED ORDER — MONTELUKAST SODIUM 10 MG PO TABS: 10.0000 mg | ORAL_TABLET | Freq: Every day | ORAL | Status: DC

## 2019-08-30 MED ORDER — PHENYLEPHRINE HCL 10 MG PO TABS: 10.0000 mg | ORAL_TABLET | ORAL | Status: DC | PRN

## 2019-08-30 NOTE — ED Notes (Signed)
Pt to room 35. Pt oriented to unit. Pt loud. Requesting food, reassured pt we will get him food.  Pt wanting to go home.

## 2019-08-30 NOTE — ED Provider Notes (Signed)
[facility]-EMERGENCY DEPT Provider Note   CSN: [id number] Arrival date & time: 08/30/19  1102     History   Chief Complaint Chief Complaint  Patient presents with  . Mental Health Problem    HPI Jason Roy is a 38 y.o. male.     HPI   Patient brought in by law enforcement, with IVC petition signed by one of the police officers.  Patient has been contacting law enforcement frequently because he feels someone is watching his house and destroying his property.  The patient has taken a warrant out on this person.  Police feel like this person is imaginary.  Patient states he is unable to sleep much because of required vigilance for the person who is watching his house.  He states he [facility], [facility], and takes medicine prescribed by them for his psychiatric illness.  Patient is a difficult historian, with pressured speech and tangential thought process.  He denies suicidal ideation.  He denies recent illnesses.  There are no other known modifying factors.  Past Medical History:  Diagnosis Date  . Asthma     There are no active problems to display for this patient.   No past surgical history on file.      Home Medications    Prior to Admission medications   Medication Sig Start Date End Date Taking? Authorizing Provider  albuterol (PROVENTIL HFA;VENTOLIN HFA) 108 (90 BASE) MCG/ACT inhaler Inhale 2 puffs into the lungs every 6 (six) hours as needed for wheezing.   Yes [provider]  albuterol (PROVENTIL) (5 MG/ML) 0.5% nebulizer solution Inhale 2.5 mg into the lungs every 4 (four) hours as needed for wheezing or shortness of breath.  08/10/14  Yes [provider]  beclomethasone (QVAR) 80 MCG/ACT inhaler Inhale 1 puff into the lungs 2 (two) times daily.   Yes [provider]  cetirizine (ZYRTEC ALLERGY) 10 MG tablet Take 1 tablet (10 mg total) by mouth daily. 09/25/15  Yes Everlene Farrieransie, William, PA-C   lisinopril (PRINIVIL,ZESTRIL) 10 MG tablet Take 10 mg by mouth daily. 05/31/15  Yes [provider]  pravastatin (PRAVACHOL) 80 MG tablet Take 80 mg by mouth every evening. 05/31/15  Yes [provider]  benzonatate (TESSALON) 100 MG capsule Take 1 capsule (100 mg total) by mouth every 8 (eight) hours. Patient not taking: Reported on 08/30/2019 09/25/15   Everlene Farrieransie, William, PA-C  fluticasone [facility](FLONASE) 50 MCG/ACT nasal spray Place 2 sprays into both nostrils daily. Patient not taking: Reported on 08/30/2019 09/25/15   Everlene Farrieransie, William, PA-C  guaiFENesin (MUCINEX) 600 MG 12 hr tablet Take 600 mg by mouth 2 (two) times daily as needed for cough or to loosen phlegm.     [provider]  montelukast (SINGULAIR) 10 MG tablet Take 10 mg by mouth at bedtime.    [provider]  phenylephrine (SUDAFED PE) 10 MG TABS tablet Take 10 mg by mouth every 4 (four) hours as needed (congestion).    [provider]  Pseudoephedrine-APAP-DM (DAYQUIL PO) Take 30 mLs by mouth every 8 (eight) hours as needed (cold symptoms).    [provider]    Family History Family History  Problem Relation Age of Onset  . Asthma Mother     Social History Social History   Tobacco Use  . Smoking status: Former Smoker  Substance Use Topics  . Alcohol use: No  . Drug use: Not on file     Allergies   Patient  has no known allergies.   Review of Systems Review of Systems  All other systems reviewed and are negative.    Physical Exam Updated Vital Signs BP (!) 144/100   Pulse 66   Temp 98.5 F (36.9 C) (Oral)   Resp 18   SpO2 100%   Physical Exam Vitals signs and nursing note reviewed.  Constitutional:      General: He is not in acute distress.    Appearance: He is well-developed. He is toxic-appearing. He is not ill-appearing or diaphoretic.  HENT:     Head: Normocephalic and atraumatic.     Right Ear: External ear normal.     Left Ear: External ear  normal.  Eyes:     Conjunctiva/sclera: Conjunctivae normal.     Pupils: Pupils are equal, round, and reactive to light.  Neck:     Musculoskeletal: Normal range of motion and neck supple.     Trachea: Phonation normal.  Cardiovascular:     Rate and Rhythm: Normal rate.  Pulmonary:     Effort: Pulmonary effort is normal.     Breath sounds: Normal breath sounds.  Abdominal:     General: There is no distension.     Tenderness: There is no abdominal tenderness.  Musculoskeletal: Normal range of motion.  Skin:    General: Skin is warm and dry.  Neurological:     Mental Status: He is alert and oriented to person, place, and time.     Cranial Nerves: No cranial nerve deficit.     Sensory: No sensory deficit.     Motor: No abnormal muscle tone.     Coordination: Coordination normal.  Psychiatric:        Attention and Perception: He is inattentive.        Mood and Affect: Mood is anxious. Affect is labile.        Speech: He is communicative. Speech is rapid and pressured and tangential. Speech is not delayed or slurred.        Behavior: Behavior is agitated, aggressive and hyperactive.        Thought Content: Thought content is paranoid. Thought content does not include homicidal or suicidal ideation.        Cognition and Memory: Cognition is impaired. Memory is impaired.        Judgment: Judgment is impulsive.      ED Treatments / Results  Labs (all labs ordered are listed, but only abnormal results are displayed) Labs Reviewed  RAPID URINE DRUG SCREEN, HOSP PERFORMED - Abnormal; Notable for the following components:      Result Value   Tetrahydrocannabinol POSITIVE (*)    All other components within normal limits  SARS CORONAVIRUS 2 (TAT 6-24 HRS)  COMPREHENSIVE METABOLIC PANEL  CBC WITH DIFFERENTIAL/PLATELET  ETHANOL    EKG None  Radiology No results found.  Procedures Procedures (including critical care time)  Medications Ordered in ED Medications  albuterol  (VENTOLIN HFA) 108 (90 Base) MCG/ACT inhaler 2 puff (has no administration in time range)  beclomethasone (QVAR) 40 MCG/ACT inhaler 2 puff (has no administration in time range)  lisinopril (ZESTRIL) tablet 10 mg (10 mg Oral Given 08/30/19 1622)  pravastatin (PRAVACHOL) tablet 80 mg (has no administration in time range)     Initial Impression / Assessment and Plan / ED Course  I have reviewed the triage vital signs and the nursing notes.  Pertinent labs & imaging results that were available during my care of the patient were reviewed by  me and considered in my medical decision making (see chart for details).  Clinical Course as of Aug 29 1709  Sun Aug 30, 2019  1208 Patient evaluated.  IVC reviewed.  I will uphold the petition, with the first examination form.   [EW]  1710 Normal  Ethanol [EW]  1710 Normal  Comprehensive metabolic panel [EW]  1093 Normal except THC present  Urine rapid drug screen (hosp performed)(!) [EW]  1710 Normal  CBC with Differential [EW]  1710 At this time he is medically cleared for treatment by psychiatry   [EW]    Clinical Course User Index [EW] Daleen Bo, MD        Patient Vitals for the past 24 hrs:  BP Temp Temp src Pulse Resp SpO2  08/30/19 1531 (!) 144/100 98.5 F (36.9 C) Oral 66 18 100 %  08/30/19 1126 (!) 166/125 98 F (36.7 C) Oral 81 17 100 %    TTS consult.    Medical Decision Making: Acute psychosis with agitation.  Patient is delusional.  He has been progressively worse despite treatment at home with medications and psychiatry evaluations.  He requires IVC, and likely placement in a psychiatric facility.  CRITICAL CARE-no Performed by: Daleen Bo  Nursing Notes Reviewed/ Care Coordinated Applicable Imaging Reviewed Interpretation of Laboratory Data incorporated into ED treatment   Plan-as per TTS in conjunction with oncoming provider team  Final Clinical Impressions(s) / ED Diagnoses   Final diagnoses:  None     ED Discharge Orders    None       Daleen Bo, MD 08/30/19 1711

## 2019-08-30 NOTE — ED Notes (Signed)
As I write this, he is comfortably napping.

## 2019-08-30 NOTE — ED Notes (Signed)
Nutritional services called at this time. Meal tray requested for pt.
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
## 2019-08-30 NOTE — ED Notes (Signed)
Pt pleasant, cooperative, pt delusional, paranoid.
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
## 2019-08-30 NOTE — ED Notes (Addendum)
Pt consumed 100% of the meal tray that was provided to him. Pt requested additional food. Pt given sandwich and ginger ale at this time.

## 2019-08-31 ENCOUNTER — Encounter (HOSPITAL_COMMUNITY): Payer: Self-pay | Admitting: Registered Nurse

## 2019-08-31 DIAGNOSIS — F4322 Adjustment disorder with anxiety: Secondary | ICD-10-CM

## 2019-08-31 LAB — SARS CORONAVIRUS 2 (TAT 6-24 HRS): SARS Coronavirus 2: NEGATIVE

## 2019-08-31 MED ORDER — HYDROXYZINE PAMOATE 25 MG PO CAPS: ORAL_CAPSULE | ORAL | 0 refills | Status: AC
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
## 2019-08-31 NOTE — ED Notes (Signed)
Pt continues to improved since this morning. He is not complaining, not hitting the call bell and then ranting and has stayed in his room except to use the rest room.

## 2019-08-31 NOTE — Discharge Instructions (Signed)
For your mental health needs, you are advised to continue treatment with [facility]:       [facility]      [address]      [place], [place] [address]      [phone]      Crisis number: [phone]  
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
## 2019-08-31 NOTE — ED Notes (Signed)
Pt has calmed down since this morning.

## 2019-08-31 NOTE — ED Notes (Signed)
Pt discharged home. Discharged instructions read to pt who verbalized understanding. All belongings returned to pt. Denies SI/HI, is not delusional and not responding to internal stimuli. Escorted pt to the ED exit.   
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
## 2019-09-23 ENCOUNTER — Other Ambulatory Visit: Payer: Self-pay

## 2019-09-23 ENCOUNTER — Emergency Department (HOSPITAL_COMMUNITY)
Admission: EM | Admit: 2019-09-23 | Discharge: 2019-09-23 | Disposition: A | Discharge status: Home / Self Care | Payer: Medicaid Other | Attending: Emergency Medicine | Admitting: Emergency Medicine

## 2019-09-23 ENCOUNTER — Encounter (HOSPITAL_COMMUNITY): Payer: Self-pay

## 2019-09-23 DIAGNOSIS — Z87891 Personal history of nicotine dependence: Secondary | ICD-10-CM | POA: Diagnosis not present

## 2019-09-23 DIAGNOSIS — R519 Headache, unspecified: Secondary | ICD-10-CM | POA: Insufficient documentation

## 2019-09-23 DIAGNOSIS — Z79899 Other long term (current) drug therapy: Secondary | ICD-10-CM | POA: Insufficient documentation

## 2019-09-23 DIAGNOSIS — F1210 Cannabis abuse, uncomplicated: Secondary | ICD-10-CM | POA: Insufficient documentation

## 2019-09-23 DIAGNOSIS — G44209 Tension-type headache, unspecified, not intractable: Secondary | ICD-10-CM

## 2019-09-23 DIAGNOSIS — J45909 Unspecified asthma, uncomplicated: Secondary | ICD-10-CM | POA: Diagnosis not present

## 2019-09-23 MED ORDER — PROCHLORPERAZINE EDISYLATE 10 MG/2ML IJ SOLN
10.0000 mg | Freq: Once | INTRAMUSCULAR | Status: AC
  Administered 2019-09-23: 17:00:00 10 mg via INTRAVENOUS
  Filled 2019-09-23: qty 2

## 2019-09-23 MED ORDER — DIPHENHYDRAMINE HCL 50 MG/ML IJ SOLN
12.5000 mg | Freq: Once | INTRAMUSCULAR | Status: AC
  Administered 2019-09-23: 12.5 mg via INTRAVENOUS
  Filled 2019-09-23: qty 1

## 2019-09-23 MED ORDER — KETOROLAC TROMETHAMINE 15 MG/ML IJ SOLN
15.0000 mg | Freq: Once | INTRAMUSCULAR | Status: AC
  Administered 2019-09-23: 17:00:00 15 mg via INTRAVENOUS
  Filled 2019-09-23: qty 1

## 2019-09-23 MED ORDER — DEXAMETHASONE SODIUM PHOSPHATE 10 MG/ML IJ SOLN
10.0000 mg | Freq: Once | INTRAMUSCULAR | Status: AC
  Administered 2019-09-23: 10 mg via INTRAVENOUS
  Filled 2019-09-23: qty 1

## 2019-09-23 NOTE — ED Notes (Signed)
Pt given dinner tray at this time.  

## 2019-09-23 NOTE — ED Triage Notes (Signed)
Patient arrived [facility] from home.   C/o bilateral temporal headache X2 days.  C/o ear pressure 10/10 pain   Patient took aleve yesterday with no releif  Temp-100.2 BP-156/90 99% Ra HR-104  Hx. Anxiety, depression   Denies allergies.

## 2019-09-23 NOTE — ED Provider Notes (Signed)
[facility]-EMERGENCY DEPT Provider Note   CSN: [id number] Arrival date & time: 09/23/19  1454     History   Chief Complaint Chief Complaint  Patient presents with  . Headache    HPI Jason Roy is a 38 y.o. male with a past medical history significant for asthma and adjustment disorder with anxiety who presents to the ED with a gradual onset of a bilateral frontal headache for the past 2 days. Patient notes he never gets headaches which is what brought him to the ER. Difficult to obtain HPI due to tangential speech. He has tried Aleve at home with no relief. He denies vision changes and nausea. Headache is associated with ear fullness. Patient denies nasal congestion, fever, chills, weakness, numbness/tingling, chest pain, abdominal pain, and shortness of breath.  Past Medical History:  Diagnosis Date  . Asthma     Patient Active Problem List   Diagnosis Date Noted  . Adjustment disorder with anxiety 08/31/2019    History reviewed. No pertinent surgical history.      Home Medications    Prior to Admission medications   Medication Sig Start Date End Date Taking? Authorizing Provider  albuterol (PROVENTIL HFA;VENTOLIN HFA) 108 (90 BASE) MCG/ACT inhaler Inhale 2 puffs into the lungs every 6 (six) hours as needed for wheezing.   Yes [provider]  albuterol (PROVENTIL) (5 MG/ML) 0.5% nebulizer solution Inhale 2.5 mg into the lungs every 4 (four) hours as needed for wheezing or shortness of breath.  08/10/14  Yes [provider]  beclomethasone (QVAR) 80 MCG/ACT inhaler Inhale 1 puff into the lungs 2 (two) times daily.   Yes [provider]  citalopram (CELEXA) 20 MG tablet Take 20 mg by mouth daily before breakfast.   Yes [provider]  FLOVENT HFA 110 MCG/ACT inhaler Inhale 2 puffs into the lungs 2 (two) times daily. 07/20/19  Yes [provider]  hydrOXYzine (VISTARIL) 25 MG capsule Take 50 mg (2 tablets)  to 100 mg (4 tablets) as needed at bedtime for sleep 08/31/19  Yes Rankin, Shuvon B, NP  lisinopril (ZESTRIL) 30 MG tablet Take 30 mg by mouth daily. 08/17/19  Yes [provider]  pravastatin (PRAVACHOL) 80 MG tablet Take 80 mg by mouth every evening. 05/31/15  Yes [provider]  traZODone (DESYREL) 50 MG tablet Take 50 mg by mouth at bedtime as needed for sleep.    Yes [provider]  benzonatate (TESSALON) 100 MG capsule Take 1 capsule (100 mg total) by mouth every 8 (eight) hours. Patient not taking: Reported on 08/30/2019 09/25/15   Everlene Farrier, PA-C  cetirizine (ZYRTEC ALLERGY) 10 MG tablet Take 1 tablet (10 mg total) by mouth daily. Patient not taking: Reported on 09/23/2019 09/25/15   Everlene Farrier, PA-C  fluticasone [facility]) 50 MCG/ACT nasal spray Place 2 sprays into both nostrils daily. Patient not taking: Reported on 08/30/2019 09/25/15   Everlene Farrier, PA-C    Family History Family History  Problem Relation Age of Onset  . Asthma Mother     Social History Social History   Tobacco Use  . Smoking status: Former Smoker  Substance Use Topics  . Alcohol use: No  . Drug use: Yes    Types: Marijuana    Comment: +THC     Allergies   Aspirin   Review of Systems Review of Systems  Constitutional: Negative for chills and fever.  HENT: Positive for ear pain (ear pressure).   Gastrointestinal: Negative for  abdominal pain.  Neurological: Positive for headaches. Negative for dizziness, facial asymmetry, speech difficulty, weakness and numbness.     Physical Exam Updated Vital Signs BP 115/84 (BP Location: Right Arm)   Pulse (!) 59   Temp 98 F (36.7 C) (Oral)   Resp 14   SpO2 100%   Physical Exam Vitals signs and nursing note reviewed.  Constitutional:      General: He is not in acute distress.    Appearance: He is not toxic-appearing.  HENT:     Head: Normocephalic.     Right Ear: Tympanic membrane normal.     Left Ear:  Tympanic membrane normal.  Eyes:     Pupils: Pupils are equal, round, and reactive to light.  Neck:     Musculoskeletal: Neck supple.  Cardiovascular:     Rate and Rhythm: Normal rate and regular rhythm.     Pulses: Normal pulses.     Heart sounds: Normal heart sounds. No murmur. No friction rub. No gallop.   Pulmonary:     Effort: Pulmonary effort is normal.     Breath sounds: Normal breath sounds.  Abdominal:     General: Abdomen is flat. Bowel sounds are normal. There is no distension.     Palpations: Abdomen is soft.  Skin:    General: Skin is warm and dry.  Neurological:     General: No focal deficit present.     Comments: Speech is clear, able to follow commands CN III-XII intact Normal strength in upper and lower extremities bilaterally including dorsiflexion and plantar flexion, strong and equal grip strength Sensation normal to light and sharp touch Moves extremities without ataxia, coordination intact Normal finger to nose and rapid alternating movements No pronator drift   Psychiatric:     Comments: Tangential speech       ED Treatments / Results  Labs (all labs ordered are listed, but only abnormal results are displayed) Labs Reviewed - No data to display  EKG None  Radiology No results found.  Procedures Procedures (including critical care time)  Medications Ordered in ED Medications  prochlorperazine (COMPAZINE) injection 10 mg (10 mg Intravenous Given 09/23/19 1634)  diphenhydrAMINE (BENADRYL) injection 12.5 mg (12.5 mg Intravenous Given 09/23/19 1634)  ketorolac (TORADOL) 15 MG/ML injection 15 mg (15 mg Intravenous Given 09/23/19 1634)  dexamethasone (DECADRON) injection 10 mg (10 mg Intravenous Given 09/23/19 1634)     Initial Impression / Assessment and Plan / ED Course  I have reviewed the triage vital signs and the nursing notes.  Pertinent labs & imaging results that were available during my care of the patient were reviewed by me and  considered in my medical decision making (see chart for details).       38 year old male presents to the ED for evaluation of bilateral frontal headache. Patient denies history of headaches. Vitals reviewed and within normal limites except mild elevation of BP at 141/83. Will continue to monitor. Full neurological exam unremarkable. Tangential speech. No signs of psychosis. Patient's presentation is not concerning for [facility], ICH, meningitis, or temporal arteritis. Do not feel imaging is warranted at this time. Patient is afebrile with no focal neurological deficits, nuchal rigidity, or change in vision. Will give migraine cocktail and reevaluate patient.   5:07 PM reevaluated patient. He states his headache is completely gone. Advised patient to take OTC ibuprofen as needed for headache. Patient given PCP and neurology number to follow-up if headache reappears. Strict ED precautions discussed with patient.  Patient states understanding and agrees to plan. Patient discharged home in no acute distress.   Discussed case with Dr. Effie ShyWentz who evaluated patient and agrees with assessment and plan  Final Clinical Impressions(s) / ED Diagnoses   Final diagnoses:  Acute non intractable tension-type headache    ED Discharge Orders    None       Lorelle FormosaCheek, Caroline B, PA-C 09/23/19 2108    Mancel BaleWentz, Elliott, MD 09/24/19 [phone]

## 2019-09-23 NOTE — Discharge Instructions (Signed)
I have given you two phone numbers. One is a primary care doctor. I recommend calling to establish care. The other number is a neurologist. You can call them and schedule an appointment if you continue to have headaches. As discussed, you can take over the counter ibuprofen if your headache comes back. Return to the ER for new or wo

## 2019-09-23 NOTE — ED Provider Notes (Signed)
  Face-to-face evaluation   History: He complains of severe headache that started yesterday and has bilateral, without known cause or other symptoms.  He denies nasal congestion, fever, chills, cough, chest pain, neck pain, focal weakness or paresthesia.  He feels like this is atypical for him and he is concerned about it.  Physical exam: Alert, mildly anxious, conversant and cooperative.  He is not overtly paranoid at this time.  No dysarthria or aphasia.  He moves arms and legs equally.  Medical screening examination/treatment/procedure(s) were conducted as a shared visit with non-physician practitioner(s) and myself.  I personally evaluated the patient during the encounter    Daleen Bo, MD 09/24/19 [phone]

## 2020-03-24 ENCOUNTER — Emergency Department (HOSPITAL_COMMUNITY)
Admission: EM | Admit: 2020-03-24 | Discharge: 2020-03-24 | Disposition: A | Discharge status: Home / Self Care | Payer: Medicaid Other | Attending: Emergency Medicine | Admitting: Emergency Medicine

## 2020-03-24 ENCOUNTER — Encounter (HOSPITAL_COMMUNITY): Payer: Self-pay

## 2020-03-24 DIAGNOSIS — S61217A Laceration without foreign body of left little finger without damage to nail, initial encounter: Secondary | ICD-10-CM | POA: Diagnosis not present

## 2020-03-24 DIAGNOSIS — W268XXA Contact with other sharp object(s), not elsewhere classified, initial encounter: Secondary | ICD-10-CM | POA: Diagnosis not present

## 2020-03-24 DIAGNOSIS — Y929 Unspecified place or not applicable: Secondary | ICD-10-CM | POA: Insufficient documentation

## 2020-03-24 DIAGNOSIS — Y999 Unspecified external cause status: Secondary | ICD-10-CM | POA: Diagnosis not present

## 2020-03-24 DIAGNOSIS — J45909 Unspecified asthma, uncomplicated: Secondary | ICD-10-CM | POA: Insufficient documentation

## 2020-03-24 DIAGNOSIS — S6992XA Unspecified injury of left wrist, hand and finger(s), initial encounter: Secondary | ICD-10-CM | POA: Diagnosis present

## 2020-03-24 DIAGNOSIS — Y939 Activity, unspecified: Secondary | ICD-10-CM | POA: Diagnosis not present

## 2020-03-24 MED ORDER — LIDOCAINE HCL (PF) 1 % IJ SOLN
5.0000 mL | Freq: Once | INTRAMUSCULAR | Status: DC
  Filled 2020-03-24: qty 5

## 2020-03-24 NOTE — ED Provider Notes (Signed)
[facility] DEPARTMENT Provider Note   CSN: [id number] Arrival date & time: 03/24/20  1410     History Chief Complaint  Patient presents with  . Extremity Laceration    Jason Roy is a 39 y.o. male presents to the ER by EMS for evaluation of laceration in the left pinky finger sustained immediately prior to arrival.  He was using some type of blade and excellently cut his finger.  Reports continued mild bleeding and local pain.  His last tetanus was April 2016.  HPI     Past Medical History:  Diagnosis Date  . Asthma     Patient Active Problem List   Diagnosis Date Noted  . Adjustment disorder with anxiety 08/31/2019    History reviewed. No pertinent surgical history.     Family History  Problem Relation Age of Onset  . Asthma Mother     Social History   Tobacco Use  . Smoking status: Former Smoker  Substance Use Topics  . Alcohol use: No  . Drug use: Yes    Types: Marijuana    Comment: +THC    Home Medications Prior to Admission medications   Medication Sig Start Date End Date Taking? Authorizing Provider  albuterol (PROVENTIL HFA;VENTOLIN HFA) 108 (90 BASE) MCG/ACT inhaler Inhale 2 puffs into the lungs every 6 (six) hours as needed for wheezing.    [provider]  albuterol (PROVENTIL) (5 MG/ML) 0.5% nebulizer solution Inhale 2.5 mg into the lungs every 4 (four) hours as needed for wheezing or shortness of breath.  08/10/14   [provider]  beclomethasone (QVAR) 80 MCG/ACT inhaler Inhale 1 puff into the lungs 2 (two) times daily.    [provider]  benzonatate (TESSALON) 100 MG capsule Take 1 capsule (100 mg total) by mouth every 8 (eight) hours. Patient not taking: Reported on 08/30/2019 09/25/15   Waynetta Pean, PA-C  cetirizine (ZYRTEC ALLERGY) 10 MG tablet Take 1 tablet (10 mg total) by mouth daily. Patient not taking: Reported on 09/23/2019 09/25/15   Waynetta Pean, PA-C  citalopram  (CELEXA) 20 MG tablet Take 20 mg by mouth daily before breakfast.    [provider]  FLOVENT HFA 110 MCG/ACT inhaler Inhale 2 puffs into the lungs 2 (two) times daily. 07/20/19   [provider]  fluticasone (FLONASE) 50 MCG/ACT nasal spray Place 2 sprays into both nostrils daily. Patient not taking: Reported on 08/30/2019 09/25/15   Waynetta Pean, PA-C  hydrOXYzine (VISTARIL) 25 MG capsule Take 50 mg (2 tablets) to 100 mg (4 tablets) as needed at bedtime for sleep 08/31/19   Rankin, Shuvon B, NP  lisinopril (ZESTRIL) 30 MG tablet Take 30 mg by mouth daily. 08/17/19   [provider]  pravastatin (PRAVACHOL) 80 MG tablet Take 80 mg by mouth every evening. 05/31/15   [provider]  traZODone (DESYREL) 50 MG tablet Take 50 mg by mouth at bedtime as needed for sleep.     [provider]    Allergies    Aspirin  Review of Systems   Review of Systems  Skin: Positive for wound.  All other systems reviewed and are negative.   Physical Exam Updated Vital Signs BP 135/80   Pulse 63   Temp 98.7 F (37.1 C) (Oral)   Resp 14   Ht 5\' 9"  (1.753 m)   Wt 72.6 kg   SpO2 100%   BMI 23.63 kg/m   Physical Exam Constitutional:  Appearance: He is well-developed.  HENT:     Head: Normocephalic.     Nose: Nose normal.  Eyes:     General: Lids are normal.  Cardiovascular:     Rate and Rhythm: Normal rate.     Comments: Brisk cap refill in the left pinky finger pad, slow oozing of blood in the wound Pulmonary:     Effort: Pulmonary effort is normal. No respiratory distress.  Musculoskeletal:        General: Normal range of motion.     Cervical back: Normal range of motion.     Comments: Full range of motion of the IP joints of the left pinky finger against resistance, no evidence of tendon or significant soft tissue injury from laceration  Skin:    Comments: 2 cm laceration in the pad of the left pinky finger, gaping, slow ooze of blood.   Locally tender.  Neurological:     Mental Status: He is alert.     Comments: Sensation to light touch grossly intact in the left pinky finger pad  Psychiatric:        Behavior: Behavior normal.     ED Results / Procedures / Treatments   Labs (all labs ordered are listed, but only abnormal results are displayed) Labs Reviewed - No data to display  EKG None  Radiology No results found.  Procedures .Marland KitchenLaceration Repair  Date/Time: 03/24/2020 5:27 PM Performed by: Liberty Handy, PA-C Authorized by: Liberty Handy, PA-C   Consent:    Consent obtained:  Verbal   Consent given by:  Patient   Risks discussed:  Infection, need for additional repair, pain, poor cosmetic result and poor wound healing   Alternatives discussed:  No treatment and delayed treatment Universal protocol:    Procedure explained and questions answered to patient or proxy's satisfaction: yes     Relevant documents present and verified: yes     Required blood products, implants, devices, and special equipment available: yes     Site/side marked: yes     Immediately prior to procedure, a time out was called: yes     Patient identity confirmed:  Verbally with patient Anesthesia (see MAR for exact dosages):    Anesthesia method:  Local infiltration   Local anesthetic:  Lidocaine 1% w/o epi Laceration details:    Location:  Finger   Length (cm):  2 Repair type:    Repair type:  Simple Pre-procedure details:    Preparation:  Patient was prepped and draped in usual sterile fashion and imaging obtained to evaluate for foreign bodies Exploration:    Hemostasis achieved with:  Direct pressure   Wound extent: no foreign bodies/material noted, no muscle damage noted, no nerve damage noted, no tendon damage noted, no underlying fracture noted and no vascular damage noted     Contaminated: no   Treatment:    Area cleansed with:  Betadine and saline   Amount of cleaning:  Standard   Irrigation method:   Tap   Visualized foreign bodies/material removed: no   Skin repair:    Repair method:  Sutures   Suture size:  4-0   Wound skin closure material used: ethilon.   Suture technique:  Simple interrupted   Number of sutures:  2 Approximation:    Approximation:  Close Post-procedure details:    Dressing:  Splint for protection and non-adherent dressing   Patient tolerance of procedure:  Tolerated well, no immediate complications   (including critical care time)  Medications  Ordered in ED Medications  lidocaine (PF) (XYLOCAINE) 1 % injection 5 mL (has no administration in time range)    ED Course  I have reviewed the triage vital signs and the nursing notes.  Pertinent labs & imaging results that were available during my care of the patient were reviewed by me and considered in my medical decision making (see chart for details).    MDM Rules/Calculators/A&P                      Accidental small laceration in the left pinky pad.  Extremities grossly neurovascularly intact.  Last tetanus per chart review was April 2016.  This is a clean and minor wound and I do not think any tetanus booster is necessary.  Wound was thoroughly irrigated by me.  Laceration was repaired here with 2 sutures without immediate complications.  He had full range of motion pre and post laceration repair.  No signs of significant soft tissue, tendon, vascular injury.  Doubt fracture.  Finger was put in nonadherent dressing, splint for protection.  Recommended suture removal in 7 to 10 days.  Return precautions given.  Final Clinical Impression(s) / ED Diagnoses Final diagnoses:  Laceration of left little finger without foreign body without damage to nail, initial encounter    Rx / DC Orders ED Discharge Orders    None       Liberty Handy, PA-C 03/24/20 1729    Arby Barrette, MD 04/02/20 [phone]

## 2020-03-24 NOTE — ED Triage Notes (Signed)
Pt arrives to ED w/ c/o lac to L ring finger. Bleeding controlled in triage.

## 2020-03-24 NOTE — ED Notes (Signed)
Suture cart outside of patient room;

## 2020-03-24 NOTE — Discharge Instructions (Addendum)
You were seen in the ER for laceration.  Your laceration was repaired with 2 sutures today. Your tetanus shot was April 2016, no need for booster today. Your sutures need to come out within 7-10 days.   The original dressing should be left in place for 24 hours.  If your laceration is small enough, you can remove original dressing after 24 hours after which laceration can be opened to air. Laceration can then be gently cleaned with mild soap and water after 24 hours of laceration repair to prevent crusting over the suture knots. An antibiotic ointment can be applied to the wound as well, twice daily until suture removal. If your laceration is large or can be contaminated you can cover it with a dressing throughout the day.   You may shower or wash the wound with soap and water. Avoid prolonged soaking of stitches including swimming in chlorinated water, pools, hot tubs. Do not swim or soak in natural bodies of water because of a potential increased risk of infection.   Return for swelling, pain, redness, pus, fevers.

## 2020-04-28 ENCOUNTER — Emergency Department (HOSPITAL_COMMUNITY)
Admission: EM | Admit: 2020-04-28 | Discharge: 2020-04-28 | Disposition: A | Discharge status: Home / Self Care | Payer: Medicaid Other | Attending: Emergency Medicine | Admitting: Emergency Medicine

## 2020-04-28 DIAGNOSIS — L509 Urticaria, unspecified: Secondary | ICD-10-CM | POA: Insufficient documentation

## 2020-04-28 MED ORDER — DEXAMETHASONE 4 MG PO TABS
12.0000 mg | ORAL_TABLET | Freq: Once | ORAL | Status: AC
  Administered 2020-04-28: 12 mg via ORAL
  Filled 2020-04-28: qty 3

## 2020-04-28 MED ORDER — DIPHENHYDRAMINE HCL 25 MG PO CAPS
50.0000 mg | ORAL_CAPSULE | Freq: Once | ORAL | Status: AC
  Administered 2020-04-28: 50 mg via ORAL
  Filled 2020-04-28: qty 2

## 2020-04-28 NOTE — ED Triage Notes (Signed)
Per [facility] pt from home for hives on arms and legs that started today. Pt denies taking any medications for the symptoms and refused benadryl that EMS offered. Vitals 160/90, 80HR, 98%

## 2020-04-28 NOTE — Discharge Instructions (Addendum)
You can continue to take benadryl (diphenhydramine) 25-50 mg every 6 hours as needed for hives/itching.

## 2020-05-02 NOTE — ED Provider Notes (Signed)
[facility]-EMERGENCY DEPT Provider Note   CSN: [id number] Arrival date & time: 04/28/20  1805     History Chief Complaint  Patient presents with  . Urticaria    Jason Roy is a 39 y.o. male.  HPI    38yM with urticaria. Onset yesterday. Hasn't been able to identify novel exposure. Has been using topical steroid with mild improvement. No respiratory or GI complaints. No dizziness or lightheadedness.   Past Medical History:  Diagnosis Date  . Asthma     Patient Active Problem List   Diagnosis Date Noted  . Adjustment disorder with anxiety 08/31/2019    No past surgical history on file.     Family History  Problem Relation Age of Onset  . Asthma Mother     Social History   Tobacco Use  . Smoking status: Former Smoker  Substance Use Topics  . Alcohol use: No  . Drug use: Yes    Types: Marijuana    Comment: +THC    Home Medications Prior to Admission medications   Medication Sig Start Date End Date Taking? Authorizing Provider  albuterol (PROVENTIL HFA;VENTOLIN HFA) 108 (90 BASE) MCG/ACT inhaler Inhale 2 puffs into the lungs every 6 (six) hours as needed for wheezing.    [provider]  albuterol (PROVENTIL) (5 MG/ML) 0.5% nebulizer solution Inhale 2.5 mg into the lungs every 4 (four) hours as needed for wheezing or shortness of breath.  08/10/14   [provider]  beclomethasone (QVAR) 80 MCG/ACT inhaler Inhale 1 puff into the lungs 2 (two) times daily.    [provider]  benzonatate (TESSALON) 100 MG capsule Take 1 capsule (100 mg total) by mouth every 8 (eight) hours. Patient not taking: Reported on 08/30/2019 09/25/15   Everlene Farrier, PA-C  cetirizine (ZYRTEC ALLERGY) 10 MG tablet Take 1 tablet (10 mg total) by mouth daily. Patient not taking: Reported on 09/23/2019 09/25/15   Everlene Farrier, PA-C  citalopram (CELEXA) 20 MG tablet Take 20 mg by mouth daily before breakfast.    [provider]    FLOVENT HFA 110 MCG/ACT inhaler Inhale 2 puffs into the lungs 2 (two) times daily. 07/20/19   [provider]  fluticasone (FLONASE) 50 MCG/ACT nasal spray Place 2 sprays into both nostrils daily. Patient not taking: Reported on 08/30/2019 09/25/15   Everlene Farrier, PA-C  hydrOXYzine (VISTARIL) 25 MG capsule Take 50 mg (2 tablets) to 100 mg (4 tablets) as needed at bedtime for sleep 08/31/19   Rankin, Shuvon B, NP  lisinopril (ZESTRIL) 30 MG tablet Take 30 mg by mouth daily. 08/17/19   [provider]  pravastatin (PRAVACHOL) 80 MG tablet Take 80 mg by mouth every evening. 05/31/15   [provider]  traZODone (DESYREL) 50 MG tablet Take 50 mg by mouth at bedtime as needed for sleep.     [provider]    Allergies    Aspirin  Review of Systems   Review of Systems All systems reviewed and negative, other than as noted in HPI.  Physical Exam Updated Vital Signs There were no vitals taken for this visit.  Physical Exam Vitals and nursing note reviewed.  Constitutional:      General: He is not in acute distress.    Appearance: He is well-developed.  HENT:     Head: Normocephalic and atraumatic.  Eyes:     General:        Right eye: No discharge.  Left eye: No discharge.     Conjunctiva/sclera: Conjunctivae normal.  Cardiovascular:     Rate and Rhythm: Normal rate and regular rhythm.     Heart sounds: Normal heart sounds. No murmur heard.  No friction rub. No gallop.   Pulmonary:     Effort: Pulmonary effort is normal. No respiratory distress.     Breath sounds: Normal breath sounds.  Abdominal:     General: There is no distension.     Palpations: Abdomen is soft.     Tenderness: There is no abdominal tenderness.  Musculoskeletal:        General: No tenderness.     Cervical back: Neck supple.  Skin:    General: Skin is warm and dry.     Findings: Rash present.     Comments: Diffuse urticarial rash  Neurological:     Mental  Status: He is alert.  Psychiatric:        Behavior: Behavior normal.        Thought Content: Thought content normal.     ED Results / Procedures / Treatments   Labs (all labs ordered are listed, but only abnormal results are displayed) Labs Reviewed - No data to display  EKG None  Radiology No results found.  Procedures Procedures (including critical care time)  Medications Ordered in ED Medications  dexamethasone (DECADRON) tablet 12 mg (12 mg Oral Given 04/28/20 1839)  diphenhydrAMINE (BENADRYL) capsule 50 mg (50 mg Oral Given 04/28/20 1839)    ED Course  I have reviewed the triage vital signs and the nursing notes.  Pertinent labs & imaging results that were available during my care of the patient were reviewed by me and considered in my medical decision making (see chart for details).    MDM Rules/Calculators/A&P                          38yM with urticaria. No additional symptoms to suggest anaphylaxis. HD stable. Plan symptomatic. Tx. Return precautions discussed.   Final Clinical Impression(s) / ED Diagnoses Final diagnoses:  Urticaria    Rx / DC Orders ED Discharge Orders    None       Virgel Manifold, MD 05/02/20 1511
